# Patient Record
Sex: Female | Born: 1985 | Race: White | Hispanic: No | Marital: Married | Smoking: Former smoker
Health system: Southern US, Community
[De-identification: ages and names within clinical notes are randomized; demographics above are authoritative.]

## PROBLEM LIST (undated history)

## (undated) DIAGNOSIS — G43909 Migraine, unspecified, not intractable, without status migrainosus: Secondary | ICD-10-CM

## (undated) DIAGNOSIS — E669 Obesity, unspecified: Secondary | ICD-10-CM

## (undated) DIAGNOSIS — F419 Anxiety disorder, unspecified: Secondary | ICD-10-CM

## (undated) DIAGNOSIS — T7840XA Allergy, unspecified, initial encounter: Secondary | ICD-10-CM

## (undated) HISTORY — PX: TONSILLECTOMY: SUR1361

## (undated) HISTORY — DX: Allergy, unspecified, initial encounter: T78.40XA

## (undated) HISTORY — DX: Obesity, unspecified: E66.9

## (undated) HISTORY — DX: Anxiety disorder, unspecified: F41.9

## (undated) HISTORY — DX: Migraine, unspecified, not intractable, without status migrainosus: G43.909

---
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
## 2004-07-16 ENCOUNTER — Emergency Department: Payer: Self-pay | Admitting: Emergency Medicine

## 2005-04-02 ENCOUNTER — Emergency Department: Payer: Self-pay | Admitting: Emergency Medicine

## 2005-04-02 ENCOUNTER — Other Ambulatory Visit: Payer: Self-pay

## 2012-03-08 ENCOUNTER — Emergency Department: Payer: Self-pay | Admitting: *Deleted

## 2012-03-11 ENCOUNTER — Ambulatory Visit: Payer: Self-pay | Admitting: Orthopedic Surgery

## 2012-04-04 ENCOUNTER — Ambulatory Visit: Payer: Self-pay

## 2012-09-10 ENCOUNTER — Ambulatory Visit: Payer: Self-pay | Admitting: Family Medicine

## 2014-09-20 ENCOUNTER — Emergency Department: Payer: Self-pay | Admitting: Emergency Medicine

## 2014-09-20 LAB — CBC WITH DIFFERENTIAL/PLATELET
Basophil #: 0 10*3/uL (ref 0.0–0.1)
Basophil %: 0.5 %
Eosinophil #: 0.1 10*3/uL (ref 0.0–0.7)
Eosinophil %: 1.3 %
HCT: 42.8 % (ref 35.0–47.0)
HGB: 13.9 g/dL (ref 12.0–16.0)
Lymphocyte #: 1.4 10*3/uL (ref 1.0–3.6)
Lymphocyte %: 20.2 %
MCH: 31.8 pg (ref 26.0–34.0)
MCHC: 32.6 g/dL (ref 32.0–36.0)
MCV: 98 fL (ref 80–100)
MONOS PCT: 13.4 %
Monocyte #: 0.9 x10 3/mm (ref 0.2–0.9)
NEUTROS PCT: 64.6 %
Neutrophil #: 4.6 10*3/uL (ref 1.4–6.5)
Platelet: 293 10*3/uL (ref 150–440)
RBC: 4.39 10*6/uL (ref 3.80–5.20)
RDW: 13.2 % (ref 11.5–14.5)
WBC: 7.1 10*3/uL (ref 3.6–11.0)

## 2014-09-20 LAB — COMPREHENSIVE METABOLIC PANEL
ALT: 15 U/L
AST: 9 U/L — AB (ref 15–37)
Albumin: 3.8 g/dL (ref 3.4–5.0)
Alkaline Phosphatase: 63 U/L
Anion Gap: 6 — ABNORMAL LOW (ref 7–16)
BUN: 8 mg/dL (ref 7–18)
Bilirubin,Total: 0.5 mg/dL (ref 0.2–1.0)
CHLORIDE: 106 mmol/L (ref 98–107)
CO2: 26 mmol/L (ref 21–32)
Calcium, Total: 8.5 mg/dL (ref 8.5–10.1)
Creatinine: 0.81 mg/dL (ref 0.60–1.30)
EGFR (African American): >60
EGFR (Non-African Amer.): >60
Glucose: 94 mg/dL (ref 65–99)
OSMOLALITY: 274 (ref 275–301)
POTASSIUM: 3.6 mmol/L (ref 3.5–5.1)
Sodium: 138 mmol/L (ref 136–145)
TOTAL PROTEIN: 7.2 g/dL (ref 6.4–8.2)

## 2014-09-20 LAB — URINALYSIS, COMPLETE
BILIRUBIN, UR: NEGATIVE
Bacteria: NONE SEEN
GLUCOSE, UR: NEGATIVE mg/dL (ref 0–75)
KETONE: NEGATIVE
LEUKOCYTE ESTERASE: NEGATIVE
Nitrite: NEGATIVE
PH: 5 (ref 4.5–8.0)
Protein: NEGATIVE
RBC,UR: 1 /HPF (ref 0–5)
Specific Gravity: 1.02 (ref 1.003–1.030)
Squamous Epithelial: 1

## 2014-09-20 LAB — LIPASE, BLOOD: LIPASE: 167 U/L (ref 73–393)

## 2014-09-21 LAB — CLOSTRIDIUM DIFFICILE(ARMC)

## 2015-09-27 IMAGING — CT CT ABD-PELV W/ CM
2 of 4 series · 16 of 46 positions shown, 18 images · IV contrast (omnipaque)
Comparison: None.

CLINICAL DATA: Acute generalized abdominal pain.

EXAM:
CT ABDOMEN AND PELVIS WITH CONTRAST
TECHNIQUE: Multidetector CT imaging of the abdomen and pelvis was performed
using the standard protocol following bolus administration of
intravenous contrast.
CONTRAST:  100 mL Omnipaque 300 intravenously.

[Series 2: routine abd pel with · axial · 0.68mm/px · z∈[-555,-85]mm · 13 of 104 slices shown, 15 images]
[im 5/104  soft-tissue]
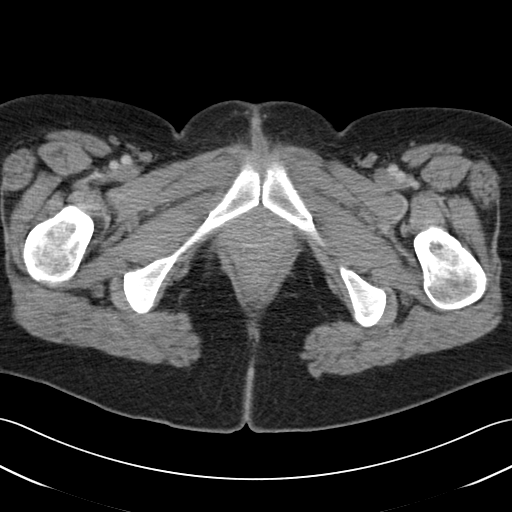
[im 5/104  bone]
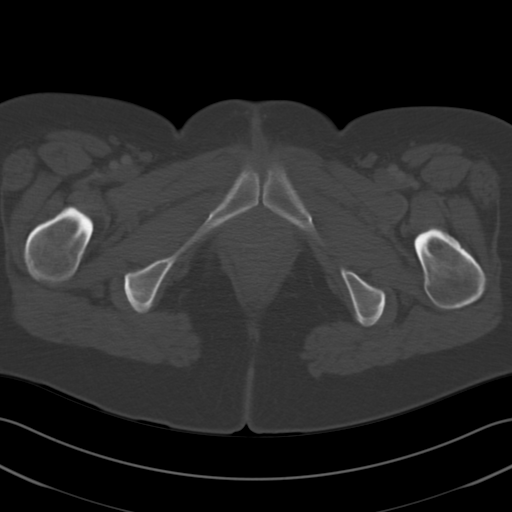
[im 13/104  soft-tissue]
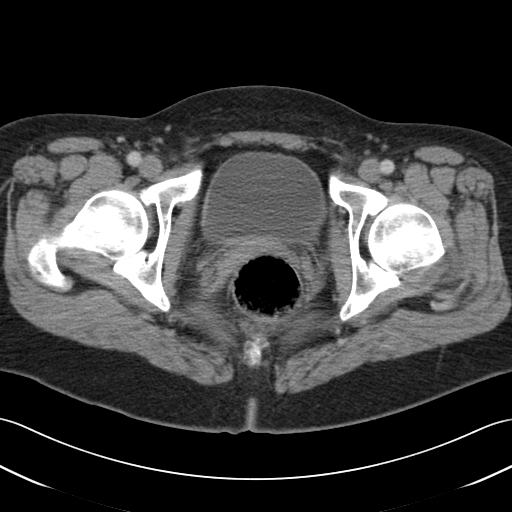
[im 21/104  soft-tissue]
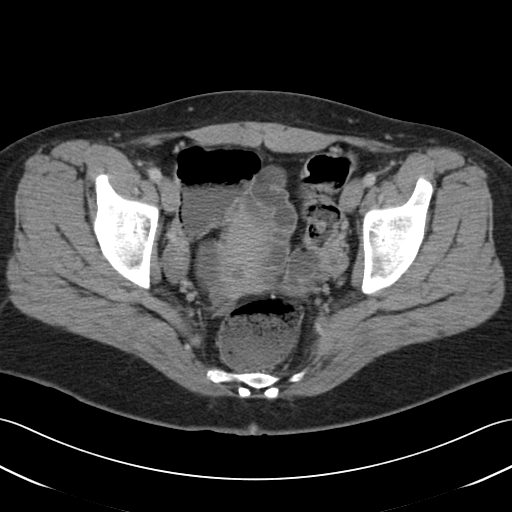
[im 29/104  soft-tissue]
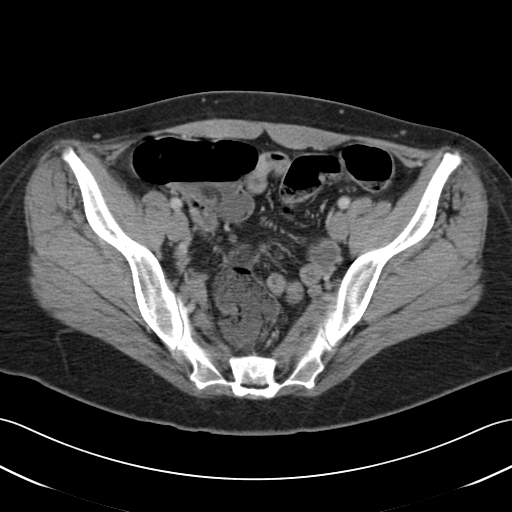
[im 38/104  soft-tissue]
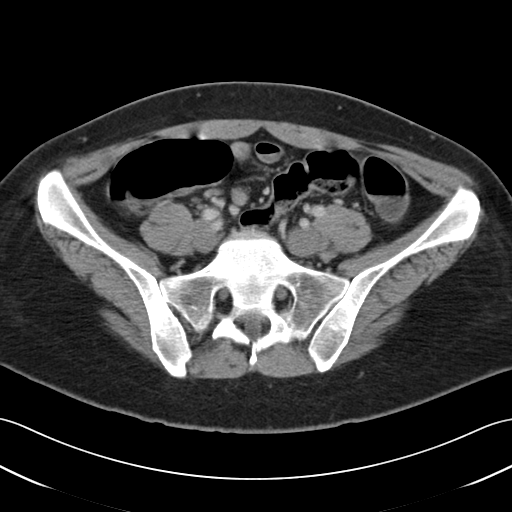
[im 46/104  soft-tissue]
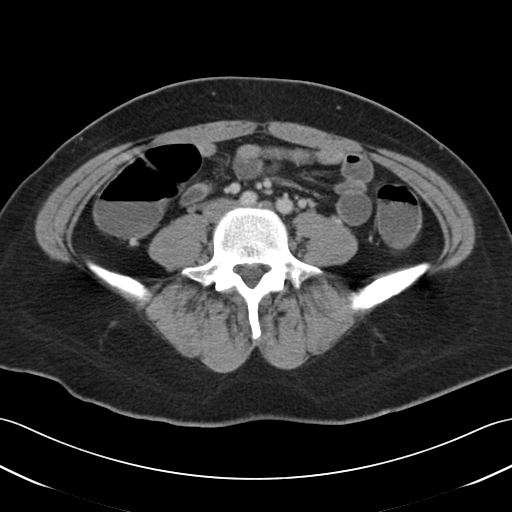
[im 54/104  soft-tissue]
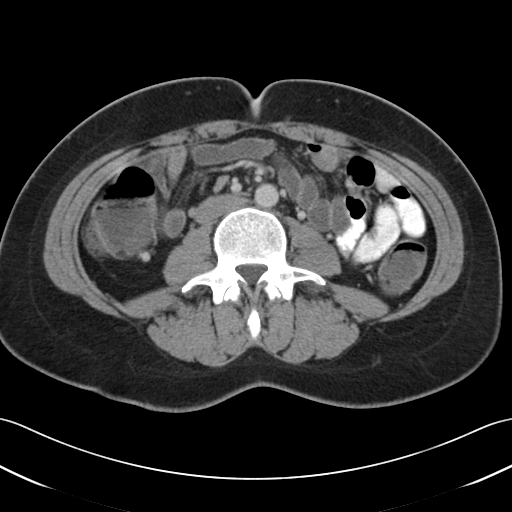
[im 58/104  soft-tissue]
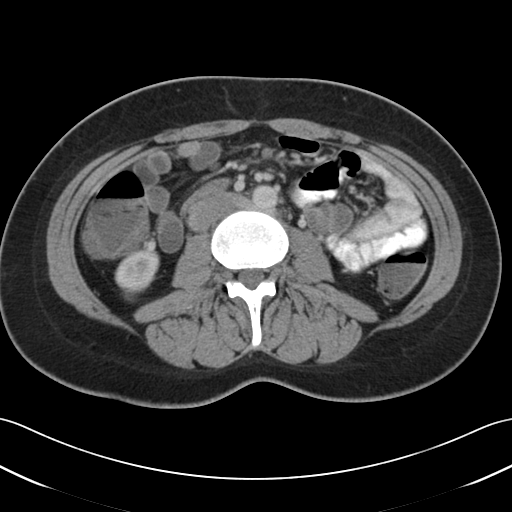
[im 66/104  soft-tissue]
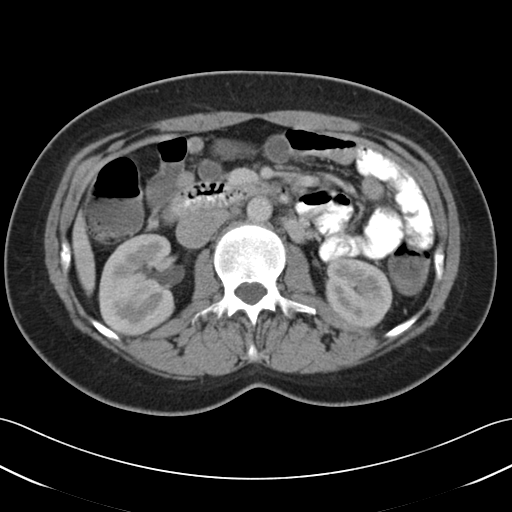
[im 66/104  bone]
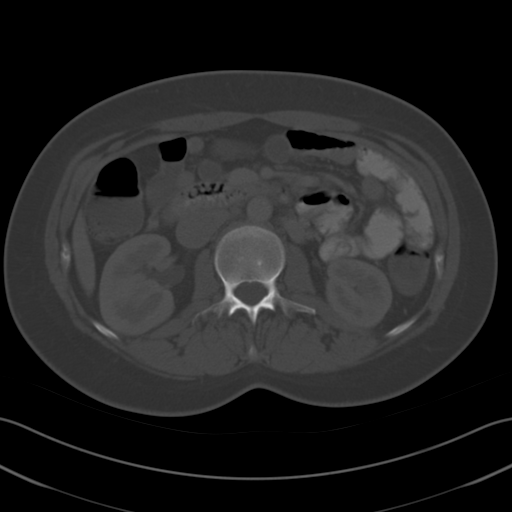
[im 75/104  soft-tissue]
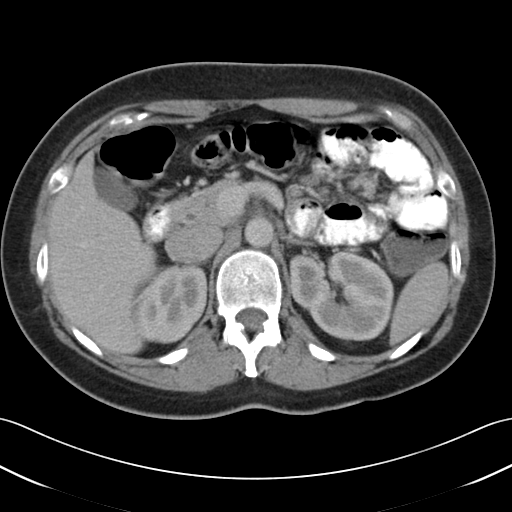
[im 83/104  soft-tissue]
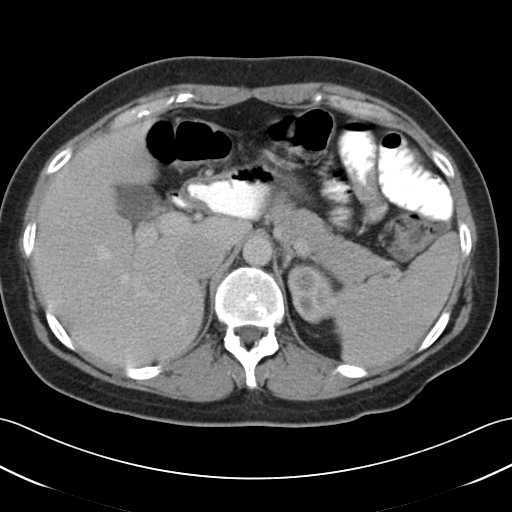
[im 91/104  soft-tissue]
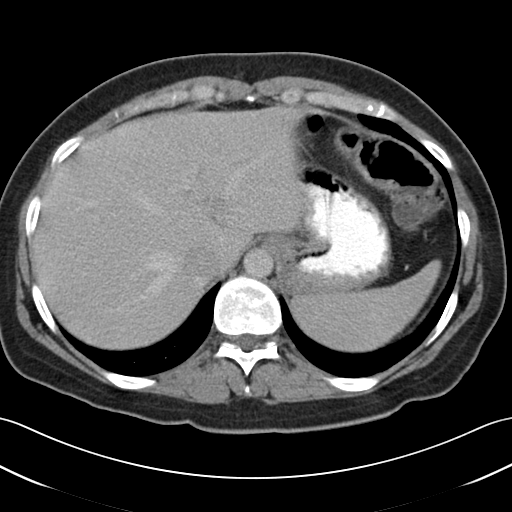
[im 99/104  soft-tissue]
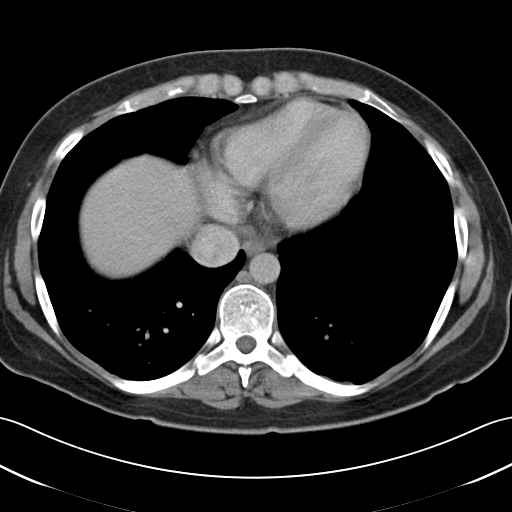

[Series 5: cor routine abd pel with · coronal · 0.69mm/px · 3 of 126 slices shown]
[im 42/126  soft-tissue]
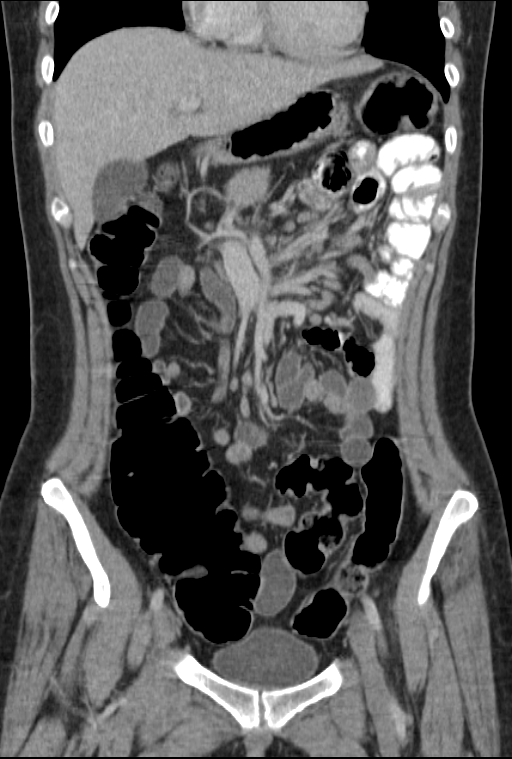
[im 56/126  soft-tissue]
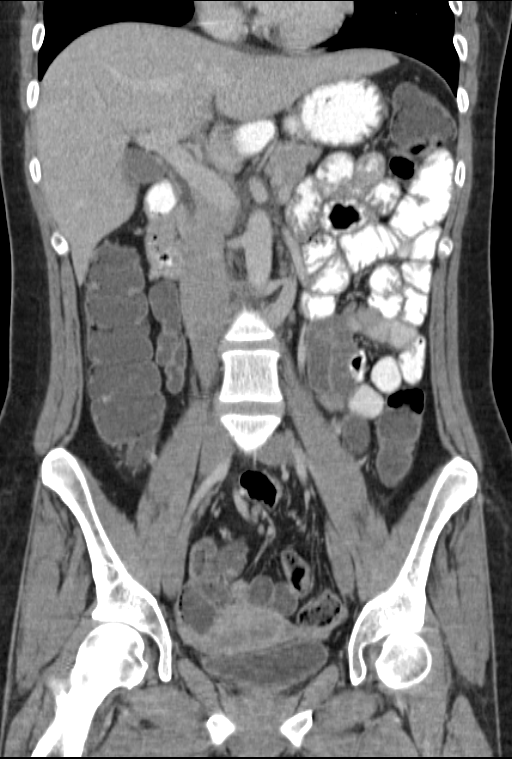
[im 70/126  soft-tissue]
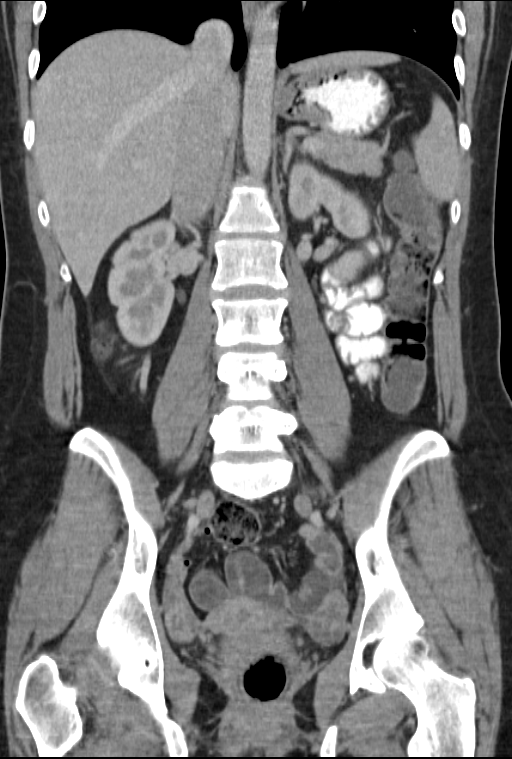

[16 of 46 positions shown; findings below may reference images not displayed]

FINDINGS: Visualized lung bases appear normal. No significant osseous
abnormality is noted.

No gallstones are noted. Several small cysts are noted in the
hepatic parenchyma. Otherwise, the liver spleen and pancreas appear
normal. Adrenal glands appear normal. No hydronephrosis or renal
obstruction is noted. No renal or ureteral calculi are noted. There
is no evidence of bowel obstruction. The appendix appears normal.
Uterus and urinary bladder appear normal. Ovaries appear normal. No
abnormal fluid collection is noted. No significant adenopathy is
noted.
IMPRESSION: No significant abnormality seen in the abdomen or pelvis.

## 2017-03-20 ENCOUNTER — Other Ambulatory Visit: Payer: Self-pay | Admitting: Nurse Practitioner

## 2017-03-20 ENCOUNTER — Ambulatory Visit
Admission: RE | Admit: 2017-03-20 | Discharge: 2017-03-20 | Disposition: A | Discharge status: Home / Self Care | Payer: 59 | Source: Ambulatory Visit | Attending: Nurse Practitioner | Admitting: Nurse Practitioner

## 2017-03-20 DIAGNOSIS — M25562 Pain in left knee: Secondary | ICD-10-CM | POA: Insufficient documentation

## 2017-03-20 DIAGNOSIS — M79605 Pain in left leg: Secondary | ICD-10-CM | POA: Diagnosis not present

## 2017-03-20 DIAGNOSIS — R52 Pain, unspecified: Secondary | ICD-10-CM

## 2018-03-06 ENCOUNTER — Ambulatory Visit: Payer: Managed Care, Other (non HMO) | Admitting: Nurse Practitioner

## 2018-03-06 ENCOUNTER — Encounter: Payer: Self-pay | Admitting: Nurse Practitioner

## 2018-03-06 VITALS — BP 134/90 | HR 65 | Resp 16 | Ht 72.0 in | Wt 252.2 lb

## 2018-03-06 DIAGNOSIS — M542 Cervicalgia: Secondary | ICD-10-CM | POA: Diagnosis not present

## 2018-03-06 DIAGNOSIS — M5000 Cervical disc disorder with myelopathy, unspecified cervical region: Secondary | ICD-10-CM

## 2018-03-06 NOTE — Progress Notes (Signed)
[facility] [address] [place], [place] [address]  Internal MEDICINE  Office Visit Note  Patient Name: Kaylee Rodgers  [id number]  [id number]  Date of Service: 03/23/2018    Pt is here for routine follow up.   Chief Complaint  Patient presents with  . Neck Pain    needs a work note  accomodation     The patient is having neck pain since job position changed 11/2017. She is more stationary and is doing more computer work. Causes neck to be stiff and sore.  She hears crunching when she turns her head from side to side. Hurts more in the morning and late in the afternoon. She is also very tall and having to position herself oddly to fit under her desk at work which is also causing increased joint and muscle pain. She has brought paperwork with her in order to get lift desk to accomodate her height and neck pain.      Current Medication: Outpatient Encounter Medications as of 03/06/2018  Medication Sig  . fexofenadine (ALLEGRA) 180 MG tablet Take 180 mg by mouth daily.  . Cholecalciferol (VITAMIN D3) 1000 units CAPS Take by mouth.   No facility-administered encounter medications on file as of 03/06/2018.     Surgical History: Past Surgical History:  Procedure Laterality Date  . TONSILLECTOMY      Medical History: Past Medical History:  Diagnosis Date  . Allergy     Family History: Family History  Problem Relation Age of Onset  . Arthritis/Rheumatoid Mother   . Diabetes Mother   . Hypertension Father     Social History   Socioeconomic History  . Marital status: Single    Spouse name: Not on file  . Number of children: Not on file  . Years of education: Not on file  . Highest education level: Not on file  Occupational History  . Not on file  Social Needs  . Financial resource strain: Not on file  . Food insecurity:    Worry: Not on file    Inability: Not on file  . Transportation needs:    Medical: Not on file    Non-medical: Not on file   Tobacco Use  . Smoking status: Never Smoker  . Smokeless tobacco: Never Used  Substance and Sexual Activity  . Alcohol use: Yes    Comment: socially  . Drug use: Never  . Sexual activity: Not on file  Lifestyle  . Physical activity:    Days per week: Not on file    Minutes per session: Not on file  . Stress: Not on file  Relationships  . Social connections:    Talks on phone: Not on file    Gets together: Not on file    Attends religious service: Not on file    Active member of club or organization: Not on file    Attends meetings of clubs or organizations: Not on file    Relationship status: Not on file  . Intimate partner violence:    Fear of current or ex partner: Not on file    Emotionally abused: Not on file    Physically abused: Not on file    Forced sexual activity: Not on file  Other Topics Concern  . Not on file  Social History Narrative  . Not on file      Review of Systems  Constitutional: Negative for activity change, chills, fatigue and unexpected weight change.  HENT: Negative for congestion, postnasal  drip, rhinorrhea, sneezing and sore throat.   Eyes: Negative.  Negative for redness.  Respiratory: Negative for cough, chest tightness, shortness of breath and wheezing.   Cardiovascular: Negative for chest pain and palpitations.  Gastrointestinal: Negative for abdominal pain, constipation, diarrhea, nausea and vomiting.  Endocrine: Negative for cold intolerance, heat intolerance, polydipsia, polyphagia and polyuria.  Genitourinary: Negative.  Negative for dysuria and frequency.  Musculoskeletal: Positive for back pain and myalgias. Negative for arthralgias, joint swelling and neck pain.  Skin: Negative for rash.  Allergic/Immunologic: Negative for environmental allergies.  Neurological: Positive for headaches. Negative for tremors and numbness.  Hematological: Negative for adenopathy. Does not bruise/bleed easily.  Psychiatric/Behavioral: Negative for  behavioral problems (Depression), dysphoric mood, sleep disturbance and suicidal ideas. The patient is not nervous/anxious.     Today's Vitals   03/06/18 1122  BP: 134/90  Pulse: 65  Resp: 16  SpO2: 98%  Weight: 252 lb 3.2 oz (114.4 kg)  Height: 6' (1.829 m)    Physical Exam  Constitutional: She is oriented to person, place, and time. She appears well-developed and well-nourished. No distress.  HENT:  Head: Normocephalic and atraumatic.  Nose: Nose normal.  Mouth/Throat: Oropharynx is clear and moist. No oropharyngeal exudate.  Eyes: Pupils are equal, round, and reactive to light. Conjunctivae and EOM are normal.  Neck: Normal range of motion. Neck supple. No JVD present. No tracheal deviation present. No thyromegaly present.  Cardiovascular: Normal rate, regular rhythm and normal heart sounds. Exam reveals no gallop and no friction rub.  No murmur heard. Pulmonary/Chest: Effort normal and breath sounds normal. No respiratory distress. She has no wheezes. She has no rales. She exhibits no tenderness.  Abdominal: Soft. Bowel sounds are normal. There is no tenderness.  Musculoskeletal: Normal range of motion.  Lymphadenopathy:    She has no cervical adenopathy.  Neurological: She is alert and oriented to person, place, and time. No cranial nerve deficit.  Skin: Skin is warm and dry. Capillary refill takes less than 2 seconds. She is not diaphoretic.  Psychiatric: She has a normal mood and affect. Her behavior is normal. Judgment and thought content normal.  Nursing note and vitals reviewed.   Assessment/Plan: 1. Neck pain, musculoskeletal X-ray cervical spine for further evaluation of neck pain. Will compose letter of necessity for patient to get standing desk to help alleviate neck pain.  - DG Cervical Spine Complete; Future  2. Cervical disc disorder with myelopathy X-ray cervical spine.  General Counseling: Kaylee Rodgers understanding of the findings of todays visit and  agrees with plan of treatment. I have discussed any further diagnostic evaluation that may be needed or ordered today. We also reviewed her medications today. she has been encouraged to call the office with any questions or concerns that should arise related to todays visit.    Counseling:    Orders Placed This Encounter  Procedures  . DG Cervical Spine Complete    This patient was seen by Vincent Gros, FNP- C in Collaboration with Dr Lyndon Code as a part of collaborative care agreement  Time spent: 25 Minutes     Dr Lyndon Code Internal medicine

## 2018-03-23 ENCOUNTER — Encounter: Payer: Self-pay | Admitting: Nurse Practitioner

## 2018-03-23 DIAGNOSIS — M542 Cervicalgia: Secondary | ICD-10-CM | POA: Insufficient documentation

## 2018-03-26 ENCOUNTER — Telehealth: Payer: Self-pay

## 2018-03-26 NOTE — Telephone Encounter (Signed)
Pt advised we faxed her paper to [facility]

## 2018-03-27 IMAGING — CR DG TIBIA/FIBULA 2V*L*
1 series · 4 of 4 positions shown · non-contrast
Comparison: None.

CLINICAL DATA: Left leg pain, no known injury, initial encounter

EXAM:
LEFT TIBIA AND FIBULA - 2 VIEW

[Series 1: dg tibia/fibula left · 0.14mm/px · 4 of 4 slices shown]
[im 1/4]
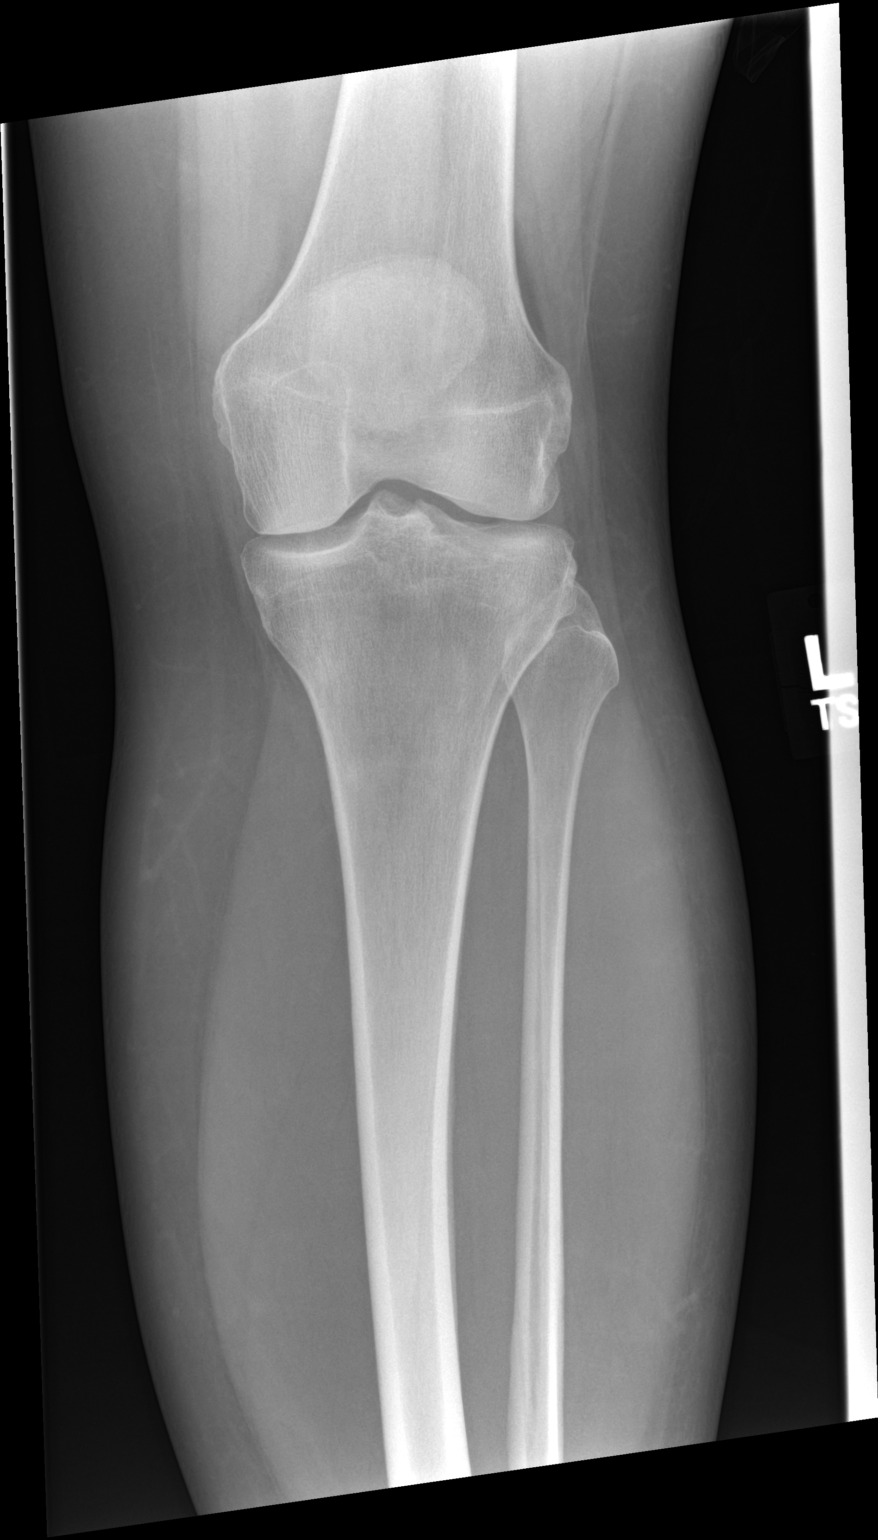
[im 2/4]
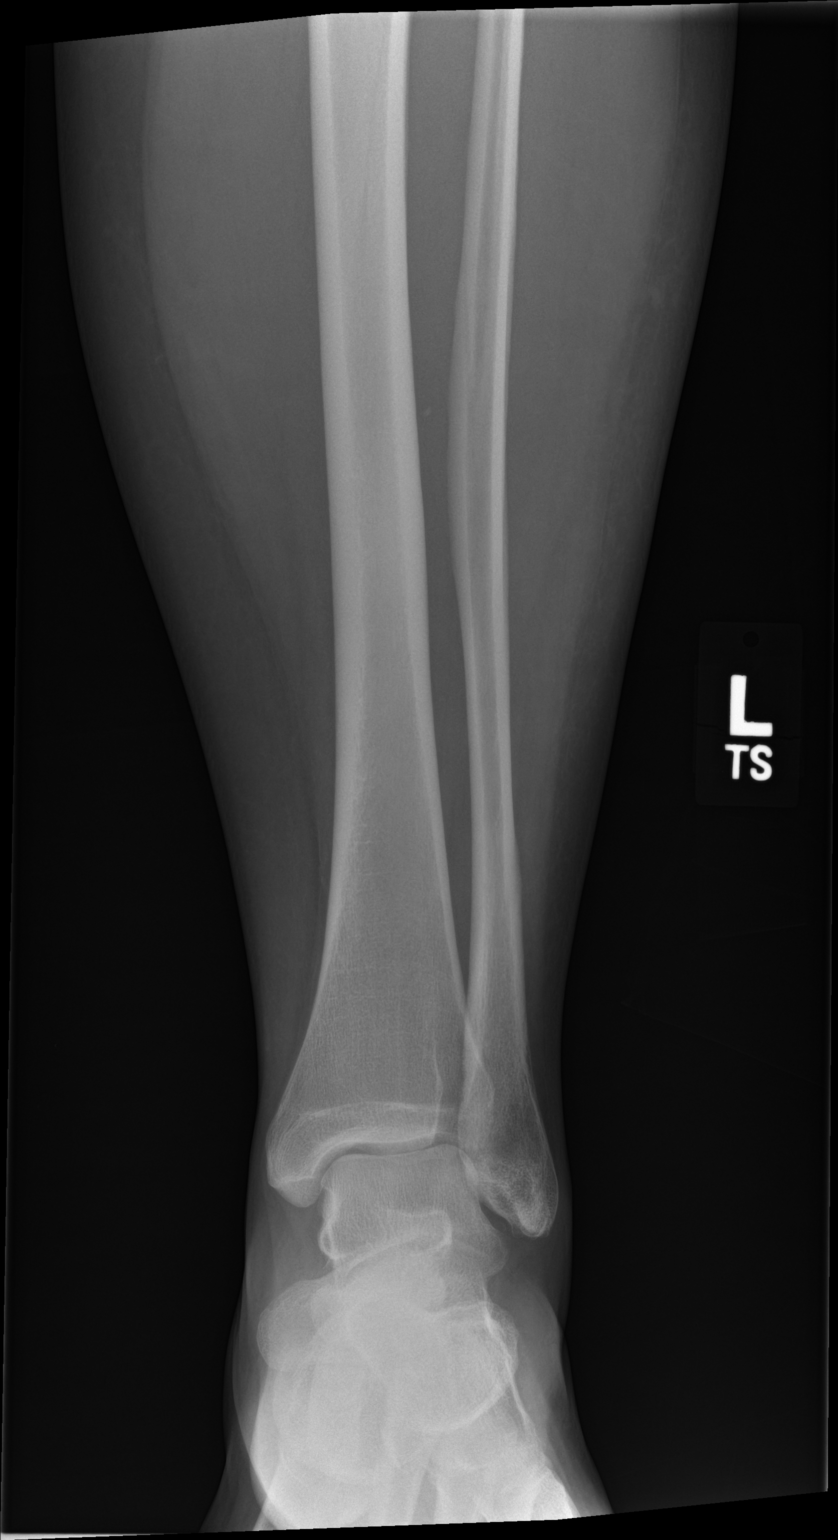
[im 3/4]
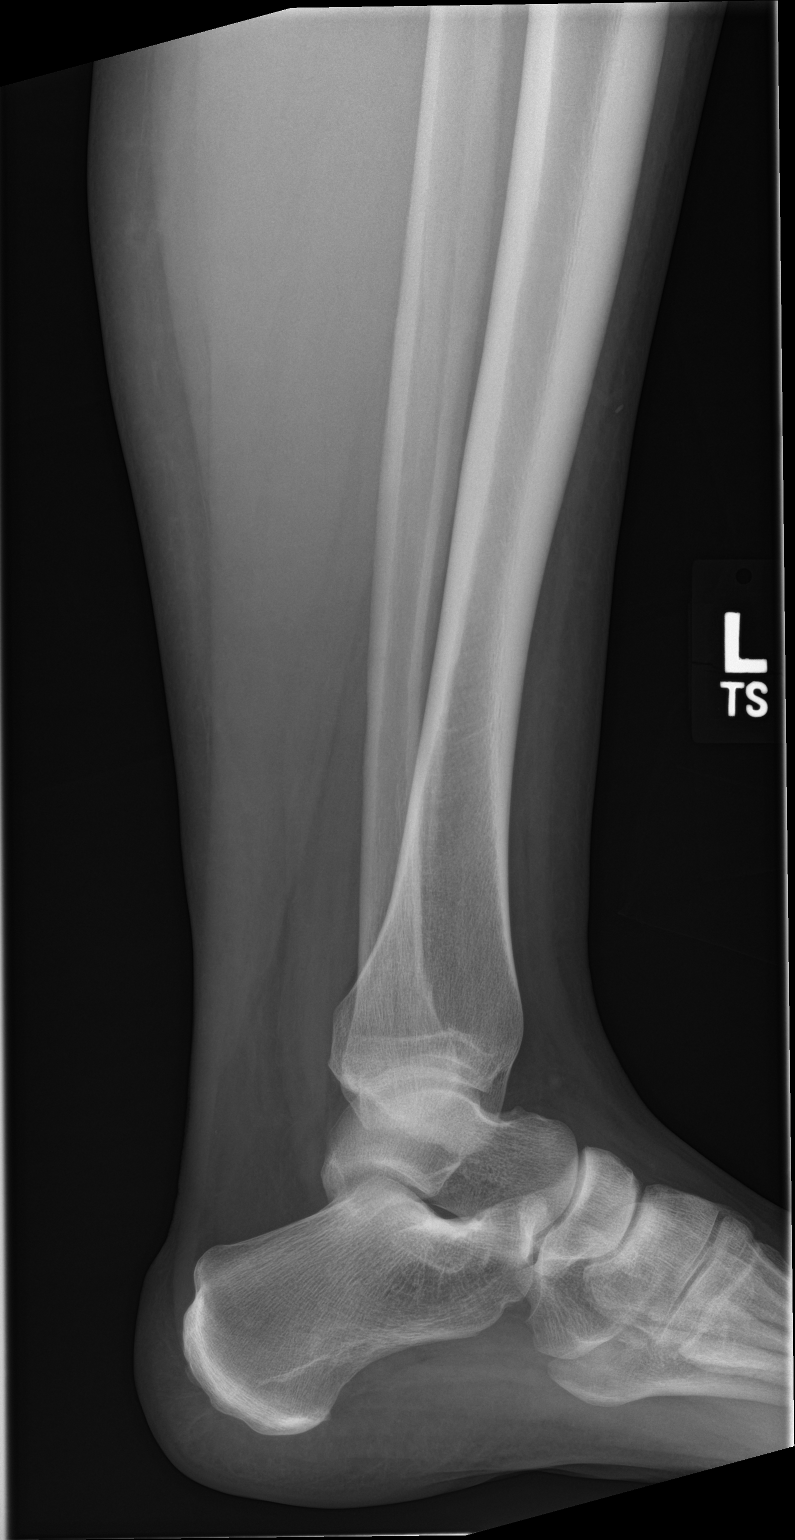
[im 4/4]
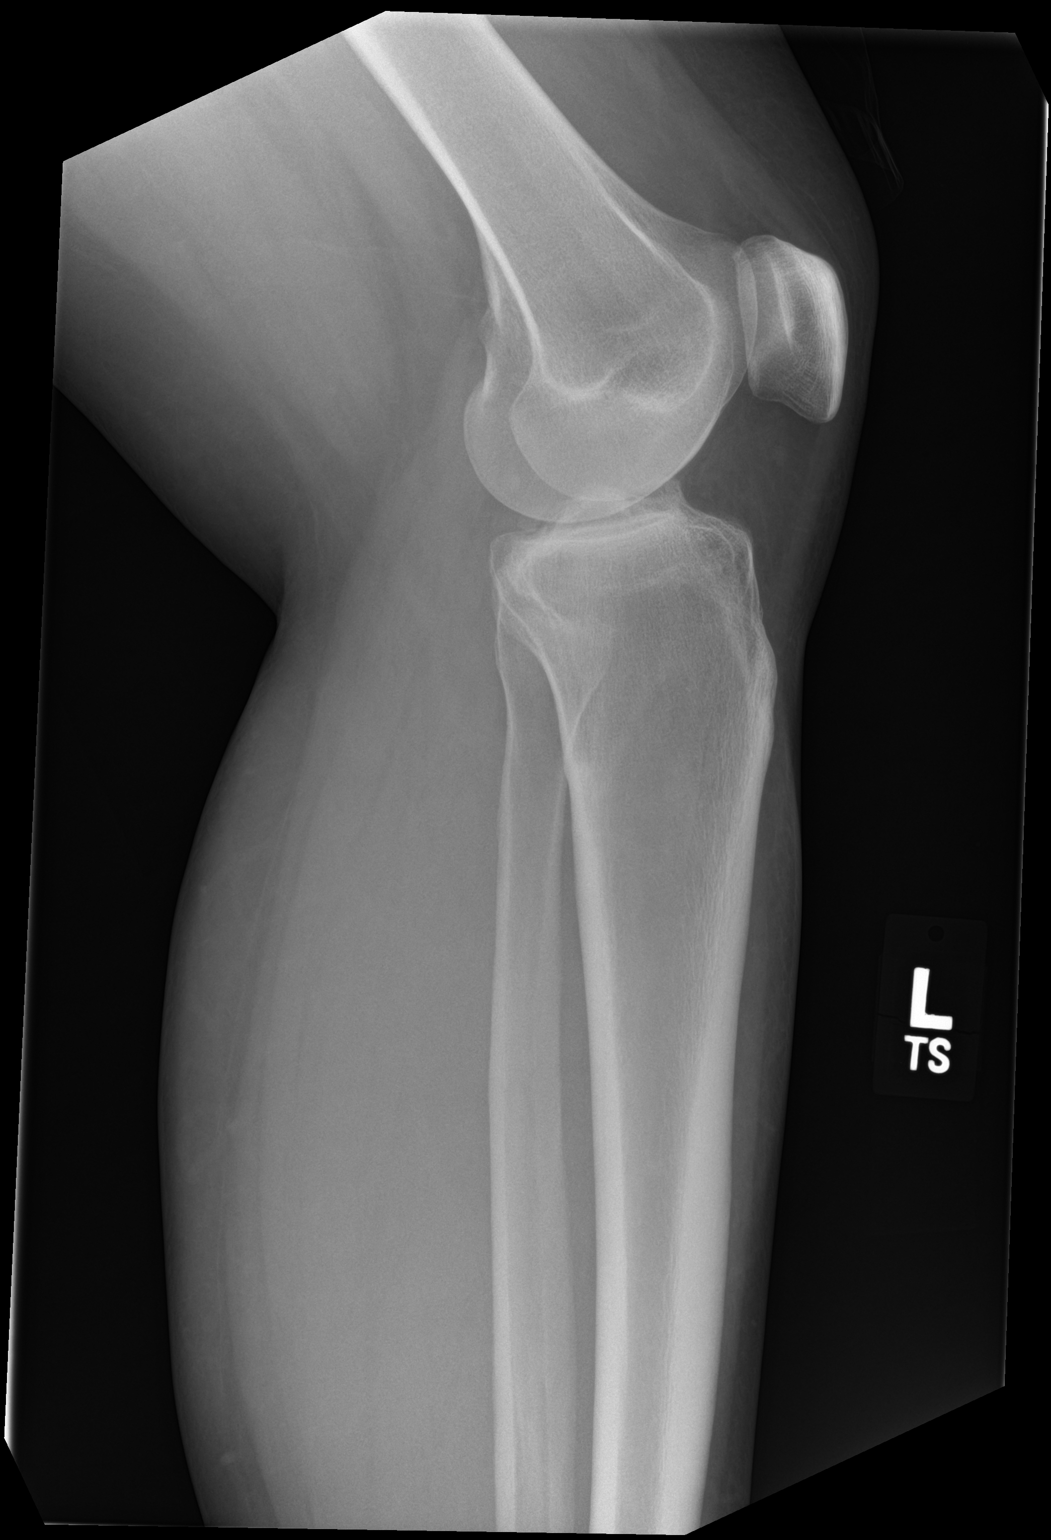

[4 of 4 positions shown; findings below may reference images not displayed]

FINDINGS: There is no evidence of fracture or other focal bone lesions. Soft
tissues are unremarkable.
IMPRESSION: No acute abnormality noted.

## 2018-03-31 ENCOUNTER — Telehealth: Payer: Self-pay | Admitting: Nurse Practitioner

## 2018-04-11 ENCOUNTER — Telehealth: Payer: Self-pay

## 2018-04-11 NOTE — Telephone Encounter (Signed)
Patient advised paperwork is ready for pickup.Kaylee Rodgers

## 2018-05-13 NOTE — Telephone Encounter (Signed)
Done, patient picked up paperwork back in July.Beth

## 2018-06-12 ENCOUNTER — Ambulatory Visit (INDEPENDENT_AMBULATORY_CARE_PROVIDER_SITE_OTHER): Payer: Managed Care, Other (non HMO) | Admitting: Nurse Practitioner

## 2018-06-12 ENCOUNTER — Encounter: Payer: Self-pay | Admitting: Nurse Practitioner

## 2018-06-12 VITALS — BP 120/80 | HR 67 | Resp 16 | Ht 72.0 in | Wt 260.0 lb

## 2018-06-12 DIAGNOSIS — Z0001 Encounter for general adult medical examination with abnormal findings: Secondary | ICD-10-CM

## 2018-06-12 DIAGNOSIS — Z6835 Body mass index (BMI) 35.0-35.9, adult: Secondary | ICD-10-CM | POA: Diagnosis not present

## 2018-06-12 DIAGNOSIS — Z23 Encounter for immunization: Secondary | ICD-10-CM

## 2018-06-12 DIAGNOSIS — M542 Cervicalgia: Secondary | ICD-10-CM

## 2018-06-12 NOTE — Progress Notes (Signed)
[facility] [address] [place], [place] [address]  Internal MEDICINE  Office Visit Note  Patient Name: Kaylee Rodgers  [id number]  [id number]  Date of Service: 06/18/2018    ationPt is here for routine health maintenance examin   Chief Complaint  Patient presents with  . Neck Pain  . Annual Exam     The patient is c/o left sided neck pain. Has been going on since job position changed 11/2017. She is more stationary and is doing more computer work. Causes neck to be stiff and sore.  She hears crunching when she turns her head from side to side. Hurts more in the morning and late in the afternoon. She is also very tall and having to position herself oddly to fit under her desk at work which is also causing increased joint and muscle pain. She has brought paperwork with her in order to get lift desk to accomodate her height and neck pain. Did get lift desk which has helped some.  She is also reporting a near ten pound weight gain over the past 1 year. Knowing that weight might become a problem with increased premiums in her health insurance, she has started working out three to four times per week. Started this new routine about 4 to 6 weeks ago. She has also started to make some very positive changes in her diet, reducing her calorie intake and improving the quality of the calories she does consume. Her health insurance has required she have an appeal form completed by me, as she has not shown any weight loss since last year. This form is required to prevent increase in her health insurance premiums.    Current Medication: Outpatient Encounter Medications as of 06/12/2018  Medication Sig  . Cholecalciferol (VITAMIN D3) 1000 units CAPS Take by mouth.  . fexofenadine (ALLEGRA) 180 MG tablet Take 180 mg by mouth daily.   No facility-administered encounter medications on file as of 06/12/2018.     Surgical History: Past Surgical History:  Procedure Laterality Date  .  TONSILLECTOMY      Medical History: Past Medical History:  Diagnosis Date  . Allergy     Family History: Family History  Problem Relation Age of Onset  . Arthritis/Rheumatoid Mother   . Diabetes Mother   . Hypertension Father       Review of Systems  Constitutional: Negative for activity change, chills, fatigue and unexpected weight change.       Ten pound weight gain over the past year.   HENT: Negative for congestion, postnasal drip, rhinorrhea, sneezing and sore throat.   Eyes: Negative.  Negative for redness.  Respiratory: Negative for cough, chest tightness, shortness of breath and wheezing.   Cardiovascular: Negative for chest pain and palpitations.  Gastrointestinal: Negative for abdominal pain, constipation, diarrhea, nausea and vomiting.  Endocrine: Negative for cold intolerance, heat intolerance, polydipsia, polyphagia and polyuria.  Genitourinary: Negative for dysuria, frequency and menstrual problem.  Musculoskeletal: Positive for back pain, myalgias, neck pain and neck stiffness. Negative for arthralgias and joint swelling.  Skin: Negative for rash.  Allergic/Immunologic: Negative for environmental allergies.  Neurological: Positive for headaches. Negative for dizziness, tremors and numbness.  Hematological: Negative for adenopathy. Does not bruise/bleed easily.  Psychiatric/Behavioral: Negative for behavioral problems (Depression), dysphoric mood, sleep disturbance and suicidal ideas. The patient is not nervous/anxious.      Today's Vitals   06/12/18 1404  BP: 120/80  Pulse: 67  Resp: 16  SpO2: 98%  Weight:  260 lb (117.9 kg)  Height: 6' (1.829 m)    Physical Exam  Constitutional: She is oriented to person, place, and time. She appears well-developed and well-nourished. No distress.  HENT:  Head: Normocephalic and atraumatic.  Nose: Nose normal.  Mouth/Throat: Oropharynx is clear and moist. No oropharyngeal exudate.  Eyes: Pupils are equal, round,  and reactive to light. Conjunctivae and EOM are normal.  Neck: Neck supple. No JVD present. Spinous process tenderness present. Decreased range of motion present. No tracheal deviation present. No thyromegaly present.  Cardiovascular: Normal rate, regular rhythm, normal heart sounds and intact distal pulses. Exam reveals no gallop and no friction rub.  No murmur heard. Pulmonary/Chest: Effort normal and breath sounds normal. No respiratory distress. She has no wheezes. She has no rales. She exhibits no tenderness. Right breast exhibits no inverted nipple, no mass, no nipple discharge, no skin change and no tenderness. Left breast exhibits no inverted nipple, no mass, no nipple discharge, no skin change and no tenderness.  Abdominal: Soft. Bowel sounds are normal. There is no tenderness.  Lymphadenopathy:    She has no cervical adenopathy.  Neurological: She is alert and oriented to person, place, and time. No cranial nerve deficit.  Skin: Skin is warm and dry. Capillary refill takes less than 2 seconds. She is not diaphoretic.  Psychiatric: She has a normal mood and affect. Her behavior is normal. Judgment and thought content normal.  Nursing note and vitals reviewed.    LABS: Recent Results (from the past 2160 hour(s))  UA/M w/rflx Culture, Routine     Status: None   Collection Time: 06/12/18  2:06 PM  Result Value Ref Range   Specific Gravity, UA 1.005 1.005 - 1.030   pH, UA 7.0 5.0 - 7.5   Color, UA Yellow Yellow   Appearance Ur Clear Clear   Leukocytes, UA Negative Negative   Protein, UA Negative Negative/Trace   Glucose, UA Negative Negative   Ketones, UA Negative Negative   RBC, UA Negative Negative   Bilirubin, UA Negative Negative   Urobilinogen, Ur 0.2 0.2 - 1.0 mg/dL   Nitrite, UA Negative Negative   Microscopic Examination Comment     Comment: Microscopic follows if indicated.   Microscopic Examination See below:     Comment: Microscopic was indicated and was performed.    Urinalysis Reflex Comment     Comment: This specimen will not reflex to a Urine Culture.  Microscopic Examination     Status: None   Collection Time: 06/12/18  2:06 PM  Result Value Ref Range   WBC, UA 0-5 0 - 5 /hpf   RBC, UA 0-2 0 - 2 /hpf   Epithelial Cells (non renal) 0-10 0 - 10 /hpf   Casts None seen None seen /lpf   Mucus, UA Present Not Estab.   Bacteria, UA Few None seen/Few    Assessment/Plan: 1. Encounter for general adult medical examination with abnormal findings Annual health maintenance exam today. - UA/M w/rflx Culture, Routine  2. Neck pain, musculoskeletal Use OTC NSAIDs and/or tylenol to reduce pain and inflammation. Recommend use of heating pad to affected area for 15-20 minutes at a time to help relieve tight muscles. Advised her to get x-ray of cervical spine, ordered at hr previous visit.   3. Body mass index (BMI) 35.0-35.9, adult Discussed weight loss plan with the patient. Recommended a 1500 calorie diet and also advised she advance her exercise regimen as tolerated. Written information was provided. Appeal form for her  health insurance was completed and returned to patient at the time of the visit.   4. Flu vaccine need - Flu Vaccine MDCK QUAD PF  General Counseling: Kaylee Rodgers verbalizes understanding of the findings of todays visit and agrees with plan of treatment. I have discussed any further diagnostic evaluation that may be needed or ordered today. We also reviewed her medications today. she has been encouraged to call the office with any questions or concerns that should arise related to todays visit.    Counseling:   There is a liability release in patients' chart. There has been a 10 minute discussion about the side effects including but not limited to elevated blood pressure, anxiety, lack of sleep and dry mouth. Pt understands and will like to start/continue on appetite suppressant at this time. There will be one month RX given at the time of  visit with proper follow up. Nova diet plan with restricted calories is given to the pt. Pt understands and agrees with  plan of treatment  This patient was seen by Vincent Gros FNP Collaboration with Dr Lyndon Code as a part of collaborative care agreement   Orders Placed This Encounter  Procedures  . Microscopic Examination  . Flu Vaccine MDCK QUAD PF  . UA/M w/rflx Culture, Routine      Time spent: 58 Minutes      Lyndon Code, MD  Internal Medicine

## 2018-06-13 LAB — UA/M W/RFLX CULTURE, ROUTINE
BILIRUBIN UA: NEGATIVE
GLUCOSE, UA: NEGATIVE
Ketones, UA: NEGATIVE
Leukocytes, UA: NEGATIVE
Nitrite, UA: NEGATIVE
PROTEIN UA: NEGATIVE
RBC, UA: NEGATIVE
SPEC GRAV UA: 1.005 (ref 1.005–1.030)
Urobilinogen, Ur: 0.2 mg/dL (ref 0.2–1.0)
pH, UA: 7 (ref 5.0–7.5)

## 2018-06-13 LAB — MICROSCOPIC EXAMINATION: Casts: NONE SEEN /lpf

## 2018-06-18 DIAGNOSIS — Z6836 Body mass index (BMI) 36.0-36.9, adult: Secondary | ICD-10-CM | POA: Insufficient documentation

## 2018-06-18 DIAGNOSIS — Z23 Encounter for immunization: Secondary | ICD-10-CM | POA: Insufficient documentation

## 2018-06-18 DIAGNOSIS — Z124 Encounter for screening for malignant neoplasm of cervix: Secondary | ICD-10-CM | POA: Insufficient documentation

## 2018-06-18 DIAGNOSIS — Z6833 Body mass index (BMI) 33.0-33.9, adult: Secondary | ICD-10-CM | POA: Insufficient documentation

## 2018-06-18 DIAGNOSIS — Z0001 Encounter for general adult medical examination with abnormal findings: Principal | ICD-10-CM

## 2018-06-18 DIAGNOSIS — Z6834 Body mass index (BMI) 34.0-34.9, adult: Secondary | ICD-10-CM | POA: Insufficient documentation

## 2018-06-18 DIAGNOSIS — Z6835 Body mass index (BMI) 35.0-35.9, adult: Secondary | ICD-10-CM | POA: Insufficient documentation

## 2018-12-05 ENCOUNTER — Encounter: Payer: Self-pay | Admitting: Adult Health

## 2018-12-05 ENCOUNTER — Ambulatory Visit: Payer: Managed Care, Other (non HMO) | Admitting: Adult Health

## 2018-12-05 ENCOUNTER — Other Ambulatory Visit: Payer: Self-pay

## 2018-12-05 VITALS — BP 122/88 | HR 81 | Temp 98.6°F | Resp 16 | Ht 72.0 in | Wt 259.0 lb

## 2018-12-05 DIAGNOSIS — J0110 Acute frontal sinusitis, unspecified: Secondary | ICD-10-CM | POA: Diagnosis not present

## 2018-12-05 DIAGNOSIS — B3731 Acute candidiasis of vulva and vagina: Secondary | ICD-10-CM

## 2018-12-05 DIAGNOSIS — B373 Candidiasis of vulva and vagina: Secondary | ICD-10-CM | POA: Diagnosis not present

## 2018-12-05 DIAGNOSIS — J029 Acute pharyngitis, unspecified: Secondary | ICD-10-CM

## 2018-12-05 DIAGNOSIS — Z6835 Body mass index (BMI) 35.0-35.9, adult: Secondary | ICD-10-CM

## 2018-12-05 LAB — POCT INFLUENZA A/B
INFLUENZA B, POC: NEGATIVE
Influenza A, POC: NEGATIVE

## 2018-12-05 MED ORDER — AMOXICILLIN-POT CLAVULANATE 875-125 MG PO TABS: 1.0000 | ORAL_TABLET | Freq: Two times a day (BID) | ORAL | 0 refills | Status: DC

## 2018-12-05 MED ORDER — FLUCONAZOLE 150 MG PO TABS: 150.0000 mg | ORAL_TABLET | ORAL | 1 refills | Status: DC | PRN

## 2018-12-05 NOTE — Patient Instructions (Signed)

## 2018-12-05 NOTE — Progress Notes (Signed)
[facility] [address] [place], [place] [address]  Internal MEDICINE  Office Visit Note  Patient Name: Kaylee Rodgers  [id number]  [id number]  Date of Service: 12/05/2018  Chief Complaint  Patient presents with  . Cough  . Headache  . Sore Throat     HPI Pt is here for a sick visit. She reports 2 days of cough, headache, sore throat, PND and ear fullness.  She denies body aches, or fever, but reports a few episodes of chills.  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      Current Medication:  Outpatient Encounter Medications as of 12/05/2018  Medication Sig  . Cholecalciferol (VITAMIN D3) 1000 units CAPS Take by mouth.  . fexofenadine (ALLEGRA) 180 MG tablet Take 180 mg by mouth daily.  Marland Kitchen amoxicillin-clavulanate (AUGMENTIN) 875-125 MG tablet Take 1 tablet by mouth 2 (two) times daily.  . fluconazole (DIFLUCAN) 150 MG tablet Take 1 tablet (150 mg total) by mouth every three (3) days as needed.   No facility-administered encounter medications on file as of 12/05/2018.       Medical History: Past Medical History:  Diagnosis Date  . Allergy      Vital Signs: BP 122/88   Pulse 81   Temp 98.6 F (37 C)   Resp 16   Ht 6' (1.829 m)   Wt 259 lb (117.5 kg)   SpO2 99%   BMI 35.13 kg/m    Review of Systems  Constitutional: Negative for chills, fatigue and unexpected weight change.  HENT: Positive for ear discharge, postnasal drip and sore throat. Negative for congestion, rhinorrhea and sneezing.   Eyes: Negative for photophobia, pain and redness.  Respiratory: Positive for cough. Negative for chest tightness and shortness of breath.   Cardiovascular: Negative for chest pain and palpitations.  Gastrointestinal: Negative for abdominal pain, constipation, diarrhea, nausea and vomiting.  Endocrine: Negative.   Genitourinary: Negative for dysuria and frequency.  Musculoskeletal: Negative for arthralgias, back pain, joint swelling  and neck pain.  Skin: Negative for rash.  Allergic/Immunologic: Negative.   Neurological: Negative for tremors and numbness.  Hematological: Negative for adenopathy. Does not bruise/bleed easily.  Psychiatric/Behavioral: Negative for behavioral problems and sleep disturbance. The patient is not nervous/anxious.     Physical Exam Vitals signs and nursing note reviewed.  Constitutional:      General: She is not in acute distress.    Appearance: She is well-developed. She is not diaphoretic.  HENT:     Head: Normocephalic and atraumatic.     Mouth/Throat:     Pharynx: No oropharyngeal exudate.  Eyes:     Pupils: Pupils are equal, round, and reactive to light.  Neck:     Musculoskeletal: Normal range of motion and neck supple.     Thyroid: No thyromegaly.     Vascular: No JVD.     Trachea: No tracheal deviation.  Cardiovascular:     Rate and Rhythm: Normal rate and regular rhythm.     Heart sounds: Normal heart sounds. No murmur. No friction rub. No gallop.   Pulmonary:     Effort: Pulmonary effort is normal. No respiratory distress.     Breath sounds: Normal breath sounds. No wheezing or rales.  Chest:     Chest wall: No tenderness.  Abdominal:     Palpations: Abdomen is soft.     Tenderness: There is no abdominal tenderness. There is no guarding.  Musculoskeletal: Normal range of motion.  Lymphadenopathy:     Cervical: No cervical adenopathy.  Skin:    General: Skin is warm and dry.  Neurological:     Mental Status: She is alert and oriented to person, place, and time.     Cranial Nerves: No cranial nerve deficit.  Psychiatric:        Behavior: Behavior normal.        Thought Content: Thought content normal.        Judgment: Judgment normal.    Assessment/Plan: 1. Acute non-recurrent frontal sinusitis Take Augmentin as directed, and RTC if symptoms fail to improve.   2. Sore throat Negative for Flu - POCT Influenza A/B  3. Body mass index (BMI) 35.0-35.9,  adult Obesity Counseling: Risk Assessment: An assessment of behavioral risk factors was made today and includes lack of exercise sedentary lifestyle, lack of portion control and poor dietary habits.  Risk Modification Advice: She was counseled on portion control guidelines. Restricting daily caloric intake to. . The detrimental long term effects of obesity on her health and ongoing poor compliance was also discussed with the patient.    General Counseling: lassandra amalfitano understanding of the findings of todays visit and agrees with plan of treatment. I have discussed any further diagnostic evaluation that may be needed or ordered today. We also reviewed her medications today. she has been encouraged to call the office with any questions or concerns that should arise related to todays visit.   Orders Placed This Encounter  Procedures  . POCT Influenza A/B    Meds ordered this encounter  Medications  . amoxicillin-clavulanate (AUGMENTIN) 875-125 MG tablet    Sig: Take 1 tablet by mouth 2 (two) times daily.    Dispense:  14 tablet    Refill:  0  . fluconazole (DIFLUCAN) 150 MG tablet    Sig: Take 1 tablet (150 mg total) by mouth every three (3) days as needed.    Dispense:  3 tablet    Refill:  1    Time spent: 25 Minutes  This patient was seen by Blima Ledger AGNP-C in Collaboration with Dr Lyndon Code as a part of collaborative care agreement.  Johnna Acosta AGNP-C Internal Medicine

## 2018-12-11 ENCOUNTER — Other Ambulatory Visit: Payer: Self-pay | Admitting: Adult Health

## 2018-12-11 MED ORDER — PREDNISONE 10 MG PO TABS: ORAL_TABLET | ORAL | 0 refills | Status: DC

## 2018-12-11 NOTE — Progress Notes (Signed)
Prednisone dose pack sent to patientss pharmacy for continued cough.

## 2019-02-11 ENCOUNTER — Ambulatory Visit: Payer: Managed Care, Other (non HMO) | Admitting: Nurse Practitioner

## 2019-02-11 ENCOUNTER — Other Ambulatory Visit: Payer: Self-pay

## 2019-02-11 ENCOUNTER — Encounter: Payer: Self-pay | Admitting: Nurse Practitioner

## 2019-02-11 VITALS — BP 133/89 | HR 76 | Temp 97.7°F | Resp 16 | Ht 72.0 in | Wt 263.0 lb

## 2019-02-11 DIAGNOSIS — N390 Urinary tract infection, site not specified: Secondary | ICD-10-CM | POA: Diagnosis not present

## 2019-02-11 DIAGNOSIS — R300 Dysuria: Secondary | ICD-10-CM

## 2019-02-11 LAB — POCT URINALYSIS DIPSTICK
Bilirubin, UA: NEGATIVE
Blood, UA: NEGATIVE
Glucose, UA: NEGATIVE
Ketones, UA: NEGATIVE
Leukocytes, UA: NEGATIVE
Nitrite, UA: NEGATIVE
Protein, UA: NEGATIVE
Spec Grav, UA: 1.01 (ref 1.010–1.025)
Urobilinogen, UA: 0.2 E.U./dL
pH, UA: 5 (ref 5.0–8.0)

## 2019-02-11 MED ORDER — SULFAMETHOXAZOLE-TRIMETHOPRIM 800-160 MG PO TABS: 1.0000 | ORAL_TABLET | Freq: Two times a day (BID) | ORAL | 0 refills | Status: DC

## 2019-02-11 MED ORDER — PHENAZOPYRIDINE HCL 200 MG PO TABS: 200.0000 mg | ORAL_TABLET | Freq: Three times a day (TID) | ORAL | 0 refills | Status: DC | PRN

## 2019-02-11 NOTE — Progress Notes (Signed)
[facility] [address] [place], [place] [address]  Internal MEDICINE  Office Visit Note  Patient Name: Kaylee GammaKristin N Rodgers  [id number]  [id number]  Date of Service: 02/11/2019   Pt is here for a sick visit.  Chief Complaint  Patient presents with  . Urinary Tract Infection    urine frequency  . Back Pain     The patient is complaining of urinary frequency and pressure in the bladder when she urinates. Also having some lower back pain. Symptoms have been going on for past 1.5 days. She denies nausea, vomiting, or abdominal pain. She denies fever.       Current Medication:  Outpatient Encounter Medications as of 02/11/2019  Medication Sig  . Cholecalciferol (VITAMIN D3) 1000 units CAPS Take by mouth.  . fexofenadine (ALLEGRA) 180 MG tablet Take 180 mg by mouth daily.  . fluconazole (DIFLUCAN) 150 MG tablet Take 1 tablet (150 mg total) by mouth every three (3) days as needed.  . phenazopyridine (PYRIDIUM) 200 MG tablet Take 1 tablet (200 mg total) by mouth 3 (three) times daily as needed for pain.  Marland Kitchen. sulfamethoxazole-trimethoprim (BACTRIM DS) 800-160 MG tablet Take 1 tablet by mouth 2 (two) times daily.  . [DISCONTINUED] amoxicillin-clavulanate (AUGMENTIN) 875-125 MG tablet Take 1 tablet by mouth 2 (two) times daily. (Patient not taking: Reported on 02/11/2019)  . [DISCONTINUED] predniSONE (DELTASONE) 10 MG tablet Use per dose pack (Patient not taking: Reported on 02/11/2019)   No facility-administered encounter medications on file as of 02/11/2019.       Medical History: Past Medical History:  Diagnosis Date  . Allergy      Today's Vitals   02/11/19 1001  BP: 133/89  Pulse: 76  Resp: 16  Temp: 97.7 F (36.5 C)  SpO2: 100%  Weight: 263 lb (119.3 kg)  Height: 6' (1.829 m)   Body mass index is 35.67 kg/m.   Review of Systems  Constitutional: Negative for activity change, chills, fatigue and unexpected weight change.  HENT: Negative for congestion,  postnasal drip, rhinorrhea, sneezing and sore throat.   Respiratory: Negative for cough, chest tightness and shortness of breath.   Cardiovascular: Negative for chest pain and palpitations.  Gastrointestinal: Negative for abdominal pain, constipation, diarrhea, nausea and vomiting.  Genitourinary: Positive for dysuria, flank pain, frequency and urgency.  Musculoskeletal: Positive for back pain. Negative for arthralgias, joint swelling and neck pain.  Skin: Negative for rash.  Neurological: Negative for tremors and numbness.  Hematological: Negative for adenopathy. Does not bruise/bleed easily.  Psychiatric/Behavioral: Negative for behavioral problems (Depression), sleep disturbance and suicidal ideas. The patient is not nervous/anxious.     Physical Exam Vitals signs and nursing note reviewed.  Constitutional:      General: She is not in acute distress.    Appearance: Normal appearance. She is well-developed. She is not diaphoretic.  HENT:     Head: Normocephalic and atraumatic.     Mouth/Throat:     Pharynx: No oropharyngeal exudate.  Eyes:     Pupils: Pupils are equal, round, and reactive to light.  Neck:     Musculoskeletal: Normal range of motion and neck supple.     Thyroid: No thyromegaly.     Vascular: No JVD.     Trachea: No tracheal deviation.  Cardiovascular:     Rate and Rhythm: Normal rate and regular rhythm.     Heart sounds: Normal heart sounds. No murmur. No friction rub. No gallop.   Pulmonary:  Effort: Pulmonary effort is normal. No respiratory distress.     Breath sounds: Normal breath sounds. No wheezing or rales.  Chest:     Chest wall: No tenderness.  Genitourinary:    Comments: Urine sample negative for evidence of infection or pther abnormalities today.  Musculoskeletal: Normal range of motion.  Lymphadenopathy:     Cervical: No cervical adenopathy.  Skin:    General: Skin is warm and dry.  Neurological:     Mental Status: She is alert and  oriented to person, place, and time.     Cranial Nerves: No cranial nerve deficit.  Psychiatric:        Behavior: Behavior normal.        Thought Content: Thought content normal.        Judgment: Judgment normal.    Assessment/Plan: 1. Urinary tract infection without hematuria, site unspecified Treat with Bactrim DS bid for 5 days. Send urine for culture and sensitivity nd adjust antibiotics as indicated.  - sulfamethoxazole-trimethoprim (BACTRIM DS) 800-160 MG tablet; Take 1 tablet by mouth 2 (two) times daily.  Dispense: 10 tablet; Refill: 0  2. Dysuria May take pyridium 200mg  up to three times daily as needed for bladder pain and spasms. - POCT Urinalysis Dipstick - phenazopyridine (PYRIDIUM) 200 MG tablet; Take 1 tablet (200 mg total) by mouth 3 (three) times daily as needed for pain.  Dispense: 10 tablet; Refill: 0  General Counseling: Luceil verbalizes understanding of the findings of todays visit and agrees with plan of treatment. I have discussed any further diagnostic evaluation that may be needed or ordered today. We also reviewed her medications today. she has been encouraged to call the office with any questions or concerns that should arise related to todays visit.    Counseling:  This patient was seen by Vincent Gros FNP Collaboration with Dr Lyndon Code as a part of collaborative care agreement  Orders Placed This Encounter  Procedures  . POCT Urinalysis Dipstick    Meds ordered this encounter  Medications  . sulfamethoxazole-trimethoprim (BACTRIM DS) 800-160 MG tablet    Sig: Take 1 tablet by mouth 2 (two) times daily.    Dispense:  10 tablet    Refill:  0    Order Specific Question:   Supervising Provider    Answer:   Lyndon Code [[id number]]  . phenazopyridine (PYRIDIUM) 200 MG tablet    Sig: Take 1 tablet (200 mg total) by mouth 3 (three) times daily as needed for pain.    Dispense:  10 tablet    Refill:  0    Order Specific Question:   Supervising  Provider    Answer:   Lyndon Code [[id number]]    Time spent: 25 Minutes

## 2019-02-14 LAB — CULTURE, URINE COMPREHENSIVE

## 2019-02-19 ENCOUNTER — Encounter: Payer: Self-pay | Admitting: Nurse Practitioner

## 2019-02-19 ENCOUNTER — Other Ambulatory Visit: Payer: Self-pay

## 2019-02-20 ENCOUNTER — Other Ambulatory Visit: Payer: Self-pay

## 2019-02-20 DIAGNOSIS — Z20828 Contact with and (suspected) exposure to other viral communicable diseases: Secondary | ICD-10-CM

## 2019-02-24 LAB — SAR COV2 SEROLOGY (COVID19)AB(IGG),IA: SARS-CoV-2 Ab, IgG: NEGATIVE

## 2019-04-20 ENCOUNTER — Ambulatory Visit: Payer: Managed Care, Other (non HMO) | Admitting: Nurse Practitioner

## 2019-04-20 ENCOUNTER — Other Ambulatory Visit: Payer: Self-pay

## 2019-04-20 ENCOUNTER — Encounter: Payer: Self-pay | Admitting: Nurse Practitioner

## 2019-04-20 VITALS — BP 118/82 | HR 85 | Temp 98.7°F | Resp 16 | Ht 72.0 in | Wt 266.0 lb

## 2019-04-20 DIAGNOSIS — L247 Irritant contact dermatitis due to plants, except food: Secondary | ICD-10-CM

## 2019-04-20 MED ORDER — PREDNISONE 10 MG (21) PO TBPK: ORAL_TABLET | ORAL | 0 refills | Status: DC

## 2019-04-20 MED ORDER — TRIAMCINOLONE ACETONIDE 0.025 % EX CREA: 1.0000 "application " | TOPICAL_CREAM | Freq: Two times a day (BID) | CUTANEOUS | 2 refills | Status: DC

## 2019-04-20 NOTE — Progress Notes (Signed)
[facility] [place],  [address]  Internal MEDICINE  Office Visit Note  Patient Name: Kaylee Rodgers  [id number]  [id number]  Date of Service: 04/26/2019   Pt is here for a sick visit.  Chief Complaint  Patient presents with  . Rash    noticed poison ivy on saturday, pt has history of getting it really bad and esach occasion it gets worse and worse, noticed it on arms , ches, and some spots on legs, itches really bad, bathed in dawn and it helped it to not spread     The patient is here for sick visit. She has poison ivy on her arms, spreading to her legs and chest. Started Saturday after working in the yard. Has gradually become worse. Very itchy. Patient has been very sensitive to poison ivy in the past.        Current Medication:  Outpatient Encounter Medications as of 04/20/2019  Medication Sig  . Cholecalciferol (VITAMIN D3) 1000 units CAPS Take by mouth.  . fexofenadine (ALLEGRA) 180 MG tablet Take 180 mg by mouth daily.  Marland Kitchen ibuprofen (ADVIL) 200 MG tablet Take by mouth.  . predniSONE (STERAPRED UNI-PAK 21 TAB) 10 MG (21) TBPK tablet 6 day taper - take by mouth as directed for 6 days  . triamcinolone (KENALOG) 0.025 % cream Apply 1 application topically 2 (two) times daily.  . [DISCONTINUED] fluconazole (DIFLUCAN) 150 MG tablet Take 1 tablet (150 mg total) by mouth every three (3) days as needed. (Patient not taking: Reported on 04/20/2019)  . [DISCONTINUED] phenazopyridine (PYRIDIUM) 200 MG tablet Take 1 tablet (200 mg total) by mouth 3 (three) times daily as needed for pain. (Patient not taking: Reported on 04/20/2019)  . [DISCONTINUED] sulfamethoxazole-trimethoprim (BACTRIM DS) 800-160 MG tablet Take 1 tablet by mouth 2 (two) times daily. (Patient not taking: Reported on 04/20/2019)   No facility-administered encounter medications on file as of 04/20/2019.       Medical History: Past Medical History:  Diagnosis Date  . Allergy       Today's Vitals   04/20/19 1505  BP: 118/82  Pulse: 85  Resp: 16  Temp: 98.7 F (37.1 C)  SpO2: 98%  Weight: 266 lb (120.7 kg)  Height: 6' (1.829 m)   Body mass index is 36.08 kg/m.  Review of Systems  Constitutional: Negative for chills, fatigue and unexpected weight change.  HENT: Negative for congestion, postnasal drip, rhinorrhea, sneezing and sore throat.   Respiratory: Negative for cough, chest tightness and shortness of breath.   Cardiovascular: Negative for chest pain and palpitations.  Gastrointestinal: Negative for abdominal pain, constipation, diarrhea, nausea and vomiting.  Musculoskeletal: Negative for arthralgias, back pain, joint swelling and neck pain.  Skin: Positive for rash.       Poison ivy rash on both hands and lower arms. sprading to chest and abdomen .  Allergic/Immunologic: Positive for environmental allergies.  Neurological: Negative.  Negative for tremors and numbness.  Hematological: Negative for adenopathy. Does not bruise/bleed easily.  Psychiatric/Behavioral: Negative for behavioral problems (Depression), sleep disturbance and suicidal ideas. The patient is not nervous/anxious.     Physical Exam Vitals signs and nursing note reviewed.  Constitutional:      General: She is not in acute distress.    Appearance: Normal appearance. She is well-developed. She is not diaphoretic.  HENT:     Head: Normocephalic and atraumatic.     Mouth/Throat:     Pharynx: No oropharyngeal exudate.  Eyes:  Pupils: Pupils are equal, round, and reactive to light.  Neck:     Musculoskeletal: Normal range of motion and neck supple.     Thyroid: No thyromegaly.     Vascular: No JVD.     Trachea: No tracheal deviation.  Cardiovascular:     Rate and Rhythm: Normal rate and regular rhythm.     Heart sounds: Normal heart sounds. No murmur. No friction rub. No gallop.   Pulmonary:     Effort: Pulmonary effort is normal. No respiratory distress.     Breath  sounds: Normal breath sounds. No wheezing or rales.  Chest:     Chest wall: No tenderness.  Abdominal:     Palpations: Abdomen is soft.  Musculoskeletal: Normal range of motion.  Lymphadenopathy:     Cervical: No cervical adenopathy.  Skin:    General: Skin is warm and dry.     Findings: Erythema present.     Comments: There is pustular rash on both hands and wrists. Spreading up to the forearms and upper arms. Few lesions on the chest. All erythematous. Some scabbed.   Neurological:     Mental Status: She is alert and oriented to person, place, and time.     Cranial Nerves: No cranial nerve deficit.  Psychiatric:        Behavior: Behavior normal.        Thought Content: Thought content normal.        Judgment: Judgment normal.   Assessment/Plan: 1. Irritant contact dermatitis due to plants, except food Start prednisone 10mg  dose pack. Take as directed for 6 days. May use triamcinolone cream twice daily as needed to reduce itching and irritation. Recommended OTC Zenfel to use as wash to reduce spread of poison ivy or poison oak and reduce healing time. - predniSONE (STERAPRED UNI-PAK 21 TAB) 10 MG (21) TBPK tablet; 6 day taper - take by mouth as directed for 6 days  Dispense: 21 tablet; Refill: 0 - triamcinolone (KENALOG) 0.025 % cream; Apply 1 application topically 2 (two) times daily.  Dispense: 80 g; Refill: 2  General Counseling: Kaylee Rodgers verbalizes understanding of the findings of todays visit and agrees with plan of treatment. I have discussed any further diagnostic evaluation that may be needed or ordered today. We also reviewed her medications today. she has been encouraged to call the office with any questions or concerns that should arise related to todays visit.    Counseling:  This patient was seen by Vincent GrosHeather Sunset Joshi FNP Collaboration with Dr Lyndon CodeFozia M Khan as a part of collaborative care agreement  Meds ordered this encounter  Medications  . predniSONE (STERAPRED UNI-PAK  21 TAB) 10 MG (21) TBPK tablet    Sig: 6 day taper - take by mouth as directed for 6 days    Dispense:  21 tablet    Refill:  0    Order Specific Question:   Supervising Provider    Answer:   Lyndon CodeKHAN, FOZIA M [[id number]]  . triamcinolone (KENALOG) 0.025 % cream    Sig: Apply 1 application topically 2 (two) times daily.    Dispense:  80 g    Refill:  2    Order Specific Question:   Supervising Provider    Answer:   Lyndon CodeKHAN, FOZIA M [[id number]]    Time spent: 15 Minutes

## 2019-04-26 DIAGNOSIS — L247 Irritant contact dermatitis due to plants, except food: Secondary | ICD-10-CM | POA: Insufficient documentation

## 2019-06-16 ENCOUNTER — Encounter: Payer: Self-pay | Admitting: Nurse Practitioner

## 2019-06-16 ENCOUNTER — Ambulatory Visit (INDEPENDENT_AMBULATORY_CARE_PROVIDER_SITE_OTHER): Payer: Managed Care, Other (non HMO) | Admitting: Nurse Practitioner

## 2019-06-16 ENCOUNTER — Other Ambulatory Visit: Payer: Self-pay

## 2019-06-16 VITALS — BP 116/72 | HR 81 | Ht 72.0 in | Wt 260.0 lb

## 2019-06-16 DIAGNOSIS — Z124 Encounter for screening for malignant neoplasm of cervix: Secondary | ICD-10-CM

## 2019-06-16 DIAGNOSIS — R300 Dysuria: Secondary | ICD-10-CM

## 2019-06-16 DIAGNOSIS — Z0001 Encounter for general adult medical examination with abnormal findings: Secondary | ICD-10-CM | POA: Diagnosis not present

## 2019-06-16 DIAGNOSIS — R5383 Other fatigue: Secondary | ICD-10-CM | POA: Insufficient documentation

## 2019-06-16 NOTE — Progress Notes (Signed)
Kaylee Rodgers [place], [place] [address]  Internal MEDICINE  Office Visit Note  Patient Name: Kaylee Rodgers  [id number]  [id number]  Date of Service: 06/16/2019   Pt is here for routine health maintenance examination  Chief Complaint  Patient presents with  . Annual Exam  . Gynecologic Exam  . Allergies  . Ear Fullness    sometimes right ear feels full, like swimmer's ears and it comes and goes     The patient is here for health maintenance exam and pap smear. She reports feeling fluid in the right ear. Comes and goes. Has been going on for about a month. Does not really hurt. If she puts upward pressure on the lower part of the ear, she can feel it popping. She denies pain or fever. Denies nasal congestion or other symptoms.     Current Medication: Outpatient Encounter Medications as of 06/16/2019  Medication Sig  . Cholecalciferol (VITAMIN D3) 1000 units CAPS Take by mouth.  . fexofenadine (ALLEGRA) 180 MG tablet Take 180 mg by mouth daily.  Marland Kitchen ibuprofen (ADVIL) 200 MG tablet Take by mouth.  . [DISCONTINUED] predniSONE (STERAPRED UNI-PAK 21 TAB) 10 MG (21) TBPK tablet 6 day taper - take by mouth as directed for 6 days (Patient not taking: Reported on 06/16/2019)  . [DISCONTINUED] triamcinolone (KENALOG) 0.025 % cream Apply 1 application topically 2 (two) times daily. (Patient not taking: Reported on 06/16/2019)   No facility-administered encounter medications on file as of 06/16/2019.     Surgical History: Past Surgical History:  Procedure Laterality Date  . TONSILLECTOMY      Medical History: Past Medical History:  Diagnosis Date  . Allergy     Family History: Family History  Problem Relation Age of Onset  . Arthritis/Rheumatoid Mother   . Diabetes Mother   . Hypertension Father       Review of Systems  Constitutional: Negative for activity change, chills, fatigue and unexpected weight change.  HENT: Positive for postnasal drip.  Negative for congestion, rhinorrhea, sneezing and sore throat.        Right ear feels full. No pain or congestion reported   Respiratory: Negative for cough, chest tightness, shortness of breath and wheezing.   Cardiovascular: Negative for chest pain and palpitations.  Gastrointestinal: Negative for abdominal pain, constipation, diarrhea, nausea and vomiting.  Endocrine: Negative for cold intolerance, heat intolerance, polydipsia and polyuria.  Genitourinary: Negative for dysuria, frequency and menstrual problem.  Musculoskeletal: Negative for arthralgias, back pain, joint swelling and neck pain.  Skin: Negative for rash.  Neurological: Negative.  Negative for tremors and numbness.  Hematological: Negative for adenopathy. Does not bruise/bleed easily.  Psychiatric/Behavioral: Positive for decreased concentration. Negative for behavioral problems (Depression), sleep disturbance and suicidal ideas. The patient is not nervous/anxious.     Today's Vitals   06/16/19 1410  BP: 116/72  Pulse: 81  SpO2: 98%  Weight: 260 lb (117.9 kg)  Height: 6' (1.829 m)   Body mass index is 35.26 kg/m.  Physical Exam Vitals signs and nursing note reviewed.  Constitutional:      General: She is not in acute distress.    Appearance: Normal appearance. She is well-developed. She is not diaphoretic.  HENT:     Head: Normocephalic and atraumatic.     Mouth/Throat:     Pharynx: No oropharyngeal exudate.  Eyes:     Extraocular Movements: Extraocular movements intact.     Conjunctiva/sclera: Conjunctivae normal.     Pupils:  Pupils are equal, round, and reactive to light.  Neck:     Musculoskeletal: Normal range of motion and neck supple.     Thyroid: No thyromegaly.     Vascular: No JVD.     Trachea: No tracheal deviation.  Cardiovascular:     Rate and Rhythm: Normal rate and regular rhythm.     Pulses: Normal pulses.     Heart sounds: Normal heart sounds. No murmur. No friction rub. No gallop.    Pulmonary:     Effort: Pulmonary effort is normal. No respiratory distress.     Breath sounds: Normal breath sounds. No wheezing or rales.  Chest:     Chest wall: No tenderness.     Breasts:        Right: Normal. No swelling, bleeding, inverted nipple, mass, nipple discharge, skin change or tenderness.        Left: Normal. No swelling, bleeding, inverted nipple, mass, nipple discharge, skin change or tenderness.  Abdominal:     General: Bowel sounds are normal.     Palpations: Abdomen is soft.     Tenderness: There is no abdominal tenderness.  Genitourinary:    General: Normal vulva.     Exam position: Supine.     Labia:        Right: No rash or tenderness.        Left: No rash or tenderness.      Vagina: Normal. No vaginal discharge, erythema or tenderness.     Cervix: No cervical motion tenderness, discharge, friability or erythema.     Uterus: Normal.      Adnexa: Right adnexa normal and left adnexa normal.     Comments: No tenderness, masses, or organomeglay present during bimanual exam . Musculoskeletal: Normal range of motion.  Lymphadenopathy:     Cervical: No cervical adenopathy.     Lower Body: No right inguinal adenopathy. No left inguinal adenopathy.  Skin:    General: Skin is warm and dry.  Neurological:     Mental Status: She is alert and oriented to person, place, and time.     Cranial Nerves: No cranial nerve deficit.  Psychiatric:        Behavior: Behavior normal.        Thought Content: Thought content normal.        Judgment: Judgment normal.    Assessment/Plan:  1. Encounter for general adult medical examination with abnormal findings Annual health miantenance exam today  2. Other fatigue Check labs including thyroid panel for further evaluation and treatment.   3. Routine cervical smear - Pap IG and HPV (high risk) DNA detection  4. Dysuria - UA/M w/rflx Culture, Routine   General Counseling: Kaylee Rodgers verbalizes understanding of the findings of  todays visit and agrees with plan of treatment. I have discussed any further diagnostic evaluation that may be needed or ordered today. We also reviewed her medications today. she has been encouraged to call the office with any questions or concerns that should arise related to todays visit.    Counseling:  This patient was seen by Vincent GrosHeather Durward Matranga FNP Collaboration with Dr Lyndon CodeFozia M Khan as a part of collaborative care agreement  Orders Placed This Encounter  Procedures  . UA/M w/rflx Culture, Routine      Time spent: 5930 Minutes      Lyndon CodeFozia M Khan, MD  Internal Medicine

## 2019-06-17 LAB — UA/M W/RFLX CULTURE, ROUTINE
Bilirubin, UA: NEGATIVE
Glucose, UA: NEGATIVE
Ketones, UA: NEGATIVE
Leukocytes,UA: NEGATIVE
Nitrite, UA: NEGATIVE
Protein,UA: NEGATIVE
RBC, UA: NEGATIVE
Specific Gravity, UA: 1.006 (ref 1.005–1.030)
Urobilinogen, Ur: 0.2 mg/dL (ref 0.2–1.0)
pH, UA: 5.5 (ref 5.0–7.5)

## 2019-06-17 LAB — MICROSCOPIC EXAMINATION
Bacteria, UA: NONE SEEN
Casts: NONE SEEN /lpf
WBC, UA: NONE SEEN /hpf (ref 0–5)

## 2019-06-20 LAB — PAP IG AND HPV HIGH-RISK: HPV, high-risk: NEGATIVE

## 2019-06-22 ENCOUNTER — Telehealth: Payer: Self-pay

## 2019-06-22 NOTE — Telephone Encounter (Signed)
Per Nira Conn, let the patient know that her pap smear is normal

## 2019-06-22 NOTE — Progress Notes (Signed)
Please let the patient know that her pao smear is normal. Thanks.

## 2019-08-13 ENCOUNTER — Other Ambulatory Visit: Payer: Self-pay | Admitting: Nurse Practitioner

## 2019-08-14 LAB — COMPREHENSIVE METABOLIC PANEL WITH GFR
ALT: 6 IU/L (ref 0–32)
AST: 11 IU/L (ref 0–40)
Albumin/Globulin Ratio: 1.9 (ref 1.2–2.2)
Albumin: 4.3 g/dL (ref 3.8–4.8)
Alkaline Phosphatase: 76 IU/L (ref 39–117)
BUN/Creatinine Ratio: 18 (ref 9–23)
BUN: 15 mg/dL (ref 6–20)
Bilirubin Total: 0.3 mg/dL (ref 0.0–1.2)
CO2: 19 mmol/L — ABNORMAL LOW (ref 20–29)
Calcium: 9.2 mg/dL (ref 8.7–10.2)
Chloride: 108 mmol/L — ABNORMAL HIGH (ref 96–106)
Creatinine, Ser: 0.83 mg/dL (ref 0.57–1.00)
GFR calc Af Amer: 106 mL/min/1.73
GFR calc non Af Amer: 92 mL/min/1.73
Globulin, Total: 2.3 g/dL (ref 1.5–4.5)
Glucose: 92 mg/dL (ref 65–99)
Potassium: 4.4 mmol/L (ref 3.5–5.2)
Sodium: 142 mmol/L (ref 134–144)
Total Protein: 6.6 g/dL (ref 6.0–8.5)

## 2019-08-14 LAB — CBC
Hematocrit: 38.2 % (ref 34.0–46.6)
Hemoglobin: 12.8 g/dL (ref 11.1–15.9)
MCH: 30 pg (ref 26.6–33.0)
MCHC: 33.5 g/dL (ref 31.5–35.7)
MCV: 90 fL (ref 79–97)
Platelets: 360 x10E3/uL (ref 150–450)
RBC: 4.27 x10E6/uL (ref 3.77–5.28)
RDW: 12.4 % (ref 11.7–15.4)
WBC: 7.4 x10E3/uL (ref 3.4–10.8)

## 2019-08-14 LAB — LIPID PANEL W/O CHOL/HDL RATIO
Cholesterol, Total: 141 mg/dL (ref 100–199)
HDL: 46 mg/dL
LDL Chol Calc (NIH): 85 mg/dL (ref 0–99)
Triglycerides: 45 mg/dL (ref 0–149)
VLDL Cholesterol Cal: 10 mg/dL (ref 5–40)

## 2019-08-14 LAB — VITAMIN D 25 HYDROXY (VIT D DEFICIENCY, FRACTURES): Vit D, 25-Hydroxy: 28.2 ng/mL — ABNORMAL LOW (ref 30.0–100.0)

## 2019-08-14 LAB — T4, FREE: Free T4: 1.15 ng/dL (ref 0.82–1.77)

## 2019-08-14 LAB — TSH: TSH: 2.36 u[IU]/mL (ref 0.450–4.500)

## 2019-08-14 NOTE — Progress Notes (Signed)
Labs look good.

## 2019-08-17 ENCOUNTER — Other Ambulatory Visit: Payer: Self-pay

## 2019-08-17 DIAGNOSIS — Z20822 Contact with and (suspected) exposure to covid-19: Secondary | ICD-10-CM

## 2019-08-20 LAB — NOVEL CORONAVIRUS, NAA: SARS-CoV-2, NAA: NOT DETECTED

## 2019-10-12 ENCOUNTER — Emergency Department
Admission: EM | Admit: 2019-10-12 | Discharge: 2019-10-12 | Disposition: A | Discharge status: Home / Self Care | Payer: Worker's Compensation | Attending: Emergency Medicine | Admitting: Emergency Medicine

## 2019-10-12 ENCOUNTER — Other Ambulatory Visit: Payer: Self-pay

## 2019-10-12 DIAGNOSIS — S8992XA Unspecified injury of left lower leg, initial encounter: Secondary | ICD-10-CM | POA: Diagnosis present

## 2019-10-12 DIAGNOSIS — S81832A Puncture wound without foreign body, left lower leg, initial encounter: Secondary | ICD-10-CM | POA: Insufficient documentation

## 2019-10-12 DIAGNOSIS — Z87891 Personal history of nicotine dependence: Secondary | ICD-10-CM | POA: Insufficient documentation

## 2019-10-12 DIAGNOSIS — Z23 Encounter for immunization: Secondary | ICD-10-CM | POA: Diagnosis not present

## 2019-10-12 DIAGNOSIS — Y9389 Activity, other specified: Secondary | ICD-10-CM | POA: Insufficient documentation

## 2019-10-12 DIAGNOSIS — Y998 Other external cause status: Secondary | ICD-10-CM | POA: Diagnosis not present

## 2019-10-12 DIAGNOSIS — Z2914 Encounter for prophylactic rabies immune globin: Secondary | ICD-10-CM | POA: Diagnosis not present

## 2019-10-12 DIAGNOSIS — W540XXA Bitten by dog, initial encounter: Secondary | ICD-10-CM | POA: Insufficient documentation

## 2019-10-12 DIAGNOSIS — Y929 Unspecified place or not applicable: Secondary | ICD-10-CM | POA: Diagnosis not present

## 2019-10-12 DIAGNOSIS — Z203 Contact with and (suspected) exposure to rabies: Secondary | ICD-10-CM

## 2019-10-12 MED ORDER — RABIES IMMUNE GLOBULIN 150 UNIT/ML IM INJ
20.0000 [IU]/kg | INJECTION | Freq: Once | INTRAMUSCULAR | Status: AC
  Administered 2019-10-12: 2400 [IU] via INTRAMUSCULAR
  Filled 2019-10-12: qty 16

## 2019-10-12 MED ORDER — RABIES VACCINE, PCEC IM SUSR
1.0000 mL | Freq: Once | INTRAMUSCULAR | Status: AC
  Administered 2019-10-12: 15:00:00 1 mL via INTRAMUSCULAR
  Filled 2019-10-12: qty 1

## 2019-10-12 NOTE — ED Notes (Signed)
Pt states bit by a dog on Saturday. Pt states 1 small puncture to R leg, denies any complications with puncture wound healing. Pt states dog is currently detained by animal control. Pt states dog has never been vaccinated. Pt requesting information regarding rabies vaccination.

## 2019-10-12 NOTE — ED Triage Notes (Signed)
Pt states that she was bitten by a dog through her pants on Saturday. Broke the skin, pt states the bite has been reported and is here for possible rabies vaccinations

## 2019-10-12 NOTE — ED Notes (Signed)
NP reviewed D/C instructions with patient and  D/C patient at this time.

## 2019-10-12 NOTE — ED Notes (Signed)
Explained to patient awaiting pharmacy to bring meds up to do injections. Pt states understanding at this time.

## 2019-10-12 NOTE — ED Provider Notes (Signed)
[facility] Emergency Department Provider Note  ____________________________________________  Time seen: Approximately 2:12 PM  I have reviewed the triage vital signs and the nursing notes.   HISTORY  Chief Complaint Rabies Injection   HPI ICIE KUZNICKI is a 34 y.o. female presenting to the emergency department for treatment and evaluation after being bitten by dog 2 days ago.  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  A dog that was not on a leash ran around the side of the house and bit her right lower leg.  She has 1 puncture wound.  She cleaned the area well right after.  Animal control notified and have the dog in quarantine.  Dog was not vaccinated.  Patient was advised to come to the ER for rabies series.   Past Medical History:  Diagnosis Date  . Allergy     Patient Active Problem List   Diagnosis Date Noted  . Other fatigue 06/16/2019  . Irritant contact dermatitis due to plants, except food 04/26/2019  . Urinary tract infection without hematuria 02/11/2019  . Dysuria 02/11/2019  . Routine cervical smear 06/18/2018  . Flu vaccine need 06/18/2018  . Body mass index (BMI) 35.0-35.9, adult 06/18/2018  . Neck pain, musculoskeletal 03/23/2018    Past Surgical History:  Procedure Laterality Date  . TONSILLECTOMY      Prior to Admission medications   Medication Sig Start Date End Date Taking? Authorizing Provider  Cholecalciferol (VITAMIN D3) 1000 units CAPS Take by mouth.    [provider]  fexofenadine (ALLEGRA) 180 MG tablet Take 180 mg by mouth daily.    [provider]  ibuprofen (ADVIL) 200 MG tablet Take by mouth.    [provider]    Allergies Patient has no known allergies.  Family History  Problem Relation Age of Onset  . Arthritis/Rheumatoid Mother   . Diabetes Mother   . Hypertension Father     Social History Social History   Tobacco Use  . Smoking status: Former Research scientist (life sciences)   . Smokeless tobacco: Never Used  Substance Use Topics  . Alcohol use: Yes    Comment: socially  . Drug use: Never    Review of Systems  Constitutional: Negative for fever. Respiratory: Negative for cough or shortness of breath.  Musculoskeletal: Negative for myalgias Skin: Positive for puncture wound Neurological: Negative for numbness or paresthesias. ____________________________________________   PHYSICAL EXAM:  VITAL SIGNS: ED Triage Vitals  Enc Vitals Group     BP 10/12/19 1252 (!) 174/86     Pulse Rate 10/12/19 1252 88     Resp 10/12/19 1252 18     Temp 10/12/19 1252 98.9 F (37.2 C)     Temp Source 10/12/19 1252 Oral     SpO2 10/12/19 1252 100 %     Weight 10/12/19 1253 265 lb (120.2 kg)     Height 10/12/19 1253 6' (1.829 m)     Head Circumference --      Peak Flow --      Pain Score 10/12/19 1252 0     Pain Loc --      Pain Edu? --      Excl. in [place]? --      Constitutional: Well appearing. Eyes: Conjunctivae are clear without discharge or drainage. Nose: No rhinorrhea noted. Mouth/Throat: Airway is patent.  Neck: No stridor. Unrestricted range of motion observed. Cardiovascular: Capillary refill is <3 seconds.  Respiratory: Respirations are even and unlabored.. Musculoskeletal: Unrestricted range of motion  observed. Neurologic: Awake, alert, and oriented x 4.  Skin: Puncture wound noted to the right lateral lower extremity.  No lymphangitis or cellulitis  ____________________________________________   LABS (all labs ordered are listed, but only abnormal results are displayed)  Labs Reviewed - No data to display ____________________________________________  EKG  Not indicated. ____________________________________________  RADIOLOGY  Not indicated ____________________________________________   PROCEDURES  Procedures ____________________________________________   INITIAL IMPRESSION / ASSESSMENT AND PLAN / ED COURSE  EVERLEIGH COLCLASURE is a 34 y.o. female presents to the emergency department for treatment and evaluation after being bitten by an unvaccinated dog 2 days ago.  The wound appears that it is healing without any evidence of infection.  Today, she will be started on the rabies series. She is to follow up with primary care or return to the ER for symptoms of concern. She will also return for the remainder of the rabies series if unable to get them at [facility].   Medications  rabies vaccine (RABAVERT) injection 1 mL (has no administration in time range)  rabies immune globulin (HYPERAB/KEDRAB) injection 2,400 Units (has no administration in time range)     Pertinent labs & imaging results that were available during my care of the patient were reviewed by me and considered in my medical decision making (see chart for details).  ____________________________________________   FINAL CLINICAL IMPRESSION(S) / ED DIAGNOSES  Final diagnoses:  Need for post exposure prophylaxis for rabies    ED Discharge Orders    None       Note:  This document was prepared using Dragon voice recognition software and may include unintentional dictation errors.   Chinita Pester, FNP 10/12/19 1420    Dionne Bucy, MD 10/12/19 1453

## 2019-10-15 ENCOUNTER — Ambulatory Visit
Admission: EM | Admit: 2019-10-15 | Discharge: 2019-10-15 | Disposition: A | Discharge status: Home / Self Care | Payer: Worker's Compensation | Attending: Family Medicine | Admitting: Family Medicine

## 2019-10-15 MED ORDER — RABIES VACCINE, PCEC IM SUSR
1.0000 mL | Freq: Once | INTRAMUSCULAR | Status: AC
  Administered 2019-10-15: 17:00:00 1 mL via INTRAMUSCULAR

## 2019-10-15 NOTE — ED Triage Notes (Signed)
Patient here for Rabies Vaccine #2.

## 2019-10-19 ENCOUNTER — Other Ambulatory Visit: Payer: Self-pay

## 2019-10-19 ENCOUNTER — Ambulatory Visit
Admission: EM | Admit: 2019-10-19 | Discharge: 2019-10-19 | Disposition: A | Discharge status: Home / Self Care | Payer: Worker's Compensation | Attending: Emergency Medicine | Admitting: Emergency Medicine

## 2019-10-19 DIAGNOSIS — Z203 Contact with and (suspected) exposure to rabies: Secondary | ICD-10-CM | POA: Diagnosis not present

## 2019-10-19 DIAGNOSIS — Z23 Encounter for immunization: Secondary | ICD-10-CM

## 2019-10-19 MED ORDER — RABIES VACCINE, PCEC IM SUSR
1.0000 mL | Freq: Once | INTRAMUSCULAR | Status: AC
  Administered 2019-10-19: 17:00:00 1 mL via INTRAMUSCULAR

## 2019-10-19 NOTE — ED Triage Notes (Signed)
Patient is here for Day 7 Rabies Vaccine. Tolerated previous vaccines well.

## 2019-10-19 NOTE — Discharge Instructions (Signed)
Follow up in 1 week for Day 14 Rabies Injection.

## 2019-10-26 ENCOUNTER — Ambulatory Visit
Admission: EM | Admit: 2019-10-26 | Discharge: 2019-10-26 | Disposition: A | Discharge status: Home / Self Care | Payer: Worker's Compensation | Attending: Emergency Medicine | Admitting: Emergency Medicine

## 2019-10-26 DIAGNOSIS — Z23 Encounter for immunization: Secondary | ICD-10-CM | POA: Diagnosis not present

## 2019-10-26 DIAGNOSIS — Z203 Contact with and (suspected) exposure to rabies: Secondary | ICD-10-CM | POA: Diagnosis not present

## 2019-10-26 MED ORDER — RABIES VACCINE, PCEC IM SUSR
1.0000 mL | Freq: Once | INTRAMUSCULAR | Status: AC
  Administered 2019-10-26: 18:00:00 1 mL via INTRAMUSCULAR

## 2019-10-26 NOTE — ED Triage Notes (Signed)
Pt here for final rabies vaccine  

## 2019-12-16 ENCOUNTER — Telehealth: Payer: Self-pay

## 2019-12-16 NOTE — Telephone Encounter (Signed)
CONFIRMED AND SCREENED FOR 12-18-19 OV. 

## 2019-12-18 ENCOUNTER — Encounter: Payer: Self-pay | Admitting: Nurse Practitioner

## 2019-12-18 ENCOUNTER — Ambulatory Visit: Payer: Managed Care, Other (non HMO) | Admitting: Nurse Practitioner

## 2019-12-18 ENCOUNTER — Other Ambulatory Visit: Payer: Self-pay

## 2019-12-18 VITALS — BP 123/82 | HR 72 | Temp 97.0°F | Resp 16 | Ht 72.0 in | Wt 271.0 lb

## 2019-12-18 DIAGNOSIS — Z6836 Body mass index (BMI) 36.0-36.9, adult: Secondary | ICD-10-CM | POA: Diagnosis not present

## 2019-12-18 DIAGNOSIS — R5383 Other fatigue: Secondary | ICD-10-CM

## 2019-12-18 MED ORDER — PHENTERMINE HCL 37.5 MG PO TABS: 37.5000 mg | ORAL_TABLET | Freq: Every day | ORAL | 1 refills | Status: DC

## 2019-12-18 NOTE — Progress Notes (Signed)
[facility] [place], [place] [address]  Internal MEDICINE  Office Visit Note  Patient Name: Kaylee Rodgers  [id number]  [id number]  Date of Service: 12/27/2019  Chief Complaint  Patient presents with  . Follow-up    needs appetite suppressant     The patient is here for follow up visit. She is having trouble with increasing depression. Has been at home, working for over a year now. There is no prediction as to when or if she may return to the office. Currently, there are no plans for any employees to return to the building where they were working. She has been inactive these last several months. Gradually gaining weight.       Current Medication: Outpatient Encounter Medications as of 12/18/2019  Medication Sig  . Cholecalciferol (VITAMIN D3) 1000 units CAPS Take by mouth.  . fexofenadine (ALLEGRA) 180 MG tablet Take 180 mg by mouth daily.  Marland Kitchen ibuprofen (ADVIL) 200 MG tablet Take by mouth.  . phentermine (ADIPEX-P) 37.5 MG tablet Take 1 tablet (37.5 mg total) by mouth daily before breakfast.   No facility-administered encounter medications on file as of 12/18/2019.    Surgical History: Past Surgical History:  Procedure Laterality Date  . TONSILLECTOMY      Medical History: Past Medical History:  Diagnosis Date  . Allergy     Family History: Family History  Problem Relation Age of Onset  . Arthritis/Rheumatoid Mother   . Diabetes Mother   . Hypertension Father     Social History   Socioeconomic History  . Marital status: Single    Spouse name: Not on file  . Number of children: Not on file  . Years of education: Not on file  . Highest education level: Not on file  Occupational History  . Not on file  Tobacco Use  . Smoking status: Former Research scientist (life sciences)  . Smokeless tobacco: Never Used  Substance and Sexual Activity  . Alcohol use: Yes    Comment: socially  . Drug use: Never  . Sexual activity: Not on file  Other Topics Concern   . Not on file  Social History Narrative  . Not on file   Social Determinants of Health   Financial Resource Strain:   . Difficulty of Paying Living Expenses:   Food Insecurity:   . Worried About Charity fundraiser in the Last Year:   . Arboriculturist in the Last Year:   Transportation Needs:   . Film/video editor (Medical):   Marland Kitchen Lack of Transportation (Non-Medical):   Physical Activity:   . Days of Exercise per Week:   . Minutes of Exercise per Session:   Stress:   . Feeling of Stress :   Social Connections:   . Frequency of Communication with Friends and Family:   . Frequency of Social Gatherings with Friends and Family:   . Attends Religious Services:   . Active Member of Clubs or Organizations:   . Attends Archivist Meetings:   Marland Kitchen Marital Status:   Intimate Partner Violence:   . Fear of Current or Ex-Partner:   . Emotionally Abused:   Marland Kitchen Physically Abused:   . Sexually Abused:       Review of Systems  Constitutional: Negative for activity change, chills, fatigue and unexpected weight change.       Eleven pound weight gain since her last visit.   HENT: Negative for congestion, postnasal drip, rhinorrhea, sneezing and sore throat.  Respiratory: Negative for cough, chest tightness, shortness of breath and wheezing.   Cardiovascular: Negative for chest pain and palpitations.  Gastrointestinal: Negative for abdominal pain, constipation, diarrhea, nausea and vomiting.  Endocrine: Negative for cold intolerance, heat intolerance, polydipsia and polyuria.  Musculoskeletal: Negative for arthralgias, back pain, joint swelling and neck pain.  Skin: Negative for rash.  Allergic/Immunologic: Negative for environmental allergies.  Neurological: Negative for dizziness, tremors, numbness and headaches.  Hematological: Negative for adenopathy. Does not bruise/bleed easily.  Psychiatric/Behavioral: Negative for behavioral problems (Depression), sleep disturbance and  suicidal ideas. The patient is not nervous/anxious.     Today's Vitals   12/18/19 1537  BP: 123/82  Pulse: 72  Resp: 16  Temp: (!) 97 F (36.1 C)  SpO2: 99%  Weight: 271 lb (122.9 kg)  Height: 6' (1.829 m)   Body mass index is 36.75 kg/m.   Physical Exam Vitals and nursing note reviewed.  Constitutional:      General: She is not in acute distress.    Appearance: Normal appearance. She is well-developed. She is not diaphoretic.  HENT:     Head: Normocephalic and atraumatic.     Mouth/Throat:     Pharynx: No oropharyngeal exudate.  Eyes:     Pupils: Pupils are equal, round, and reactive to light.  Neck:     Thyroid: No thyromegaly.     Vascular: No JVD.     Trachea: No tracheal deviation.  Cardiovascular:     Rate and Rhythm: Normal rate and regular rhythm.     Heart sounds: Normal heart sounds. No murmur. No friction rub. No gallop.   Pulmonary:     Effort: Pulmonary effort is normal. No respiratory distress.     Breath sounds: Normal breath sounds. No wheezing or rales.  Chest:     Chest wall: No tenderness.  Abdominal:     Palpations: Abdomen is soft.  Musculoskeletal:        General: Normal range of motion.     Cervical back: Normal range of motion and neck supple.  Lymphadenopathy:     Cervical: No cervical adenopathy.  Skin:    General: Skin is warm and dry.  Neurological:     Mental Status: She is alert and oriented to person, place, and time.     Cranial Nerves: No cranial nerve deficit.  Psychiatric:        Mood and Affect: Mood normal.        Behavior: Behavior normal.        Thought Content: Thought content normal.        Judgment: Judgment normal.   Assessment/Plan: 1. Other fatigue Likely from increased stress and weight gain. Will monitor.   2. BMI 36.0-36.9,adult Will start phentermine 37..5mg  tablets daily. Recommend she limit her calorie intake to 1200/1500 calories per day ad incorporate regular physical activity into her daily routine.   - phentermine (ADIPEX-P) 37.5 MG tablet; Take 1 tablet (37.5 mg total) by mouth daily before breakfast.  Dispense: 30 tablet; Refill: 1  General Counseling: [name] verbalizes understanding of the findings of todays visit and agrees with plan of treatment. I have discussed any further diagnostic evaluation that may be needed or ordered today. We also reviewed her medications today. she has been encouraged to call the office with any questions or concerns that should arise related to todays visit.    There is a liability release in patients' chart. There has been a 10 minute discussion about the side effects including but  not limited to elevated blood pressure, anxiety, lack of sleep and dry mouth. Pt understands and will like to start/continue on appetite suppressant at this time. There will be one month RX given at the time of visit with proper follow up. Nova diet plan with restricted calories is given to the pt. Pt understands and agrees with  plan of treatment  This patient was seen by Vincent Gros FNP Collaboration with Dr Lyndon Code as a part of collaborative care agreement  Meds ordered this encounter  Medications  . phentermine (ADIPEX-P) 37.5 MG tablet    Sig: Take 1 tablet (37.5 mg total) by mouth daily before breakfast.    Dispense:  30 tablet    Refill:  1    Order Specific Question:   Supervising Provider    Answer:   Lyndon Code [[id number]]    Total time spent: 20 Minutes   Time spent includes review of chart, medications, test results, and follow up plan with the patient.      Dr Lyndon Code Internal medicine

## 2019-12-19 ENCOUNTER — Ambulatory Visit: Payer: Managed Care, Other (non HMO) | Attending: Internal Medicine

## 2019-12-19 DIAGNOSIS — Z23 Encounter for immunization: Secondary | ICD-10-CM

## 2019-12-19 NOTE — Progress Notes (Signed)
   Covid-19 Vaccination Clinic  Name:  MORGANA ROWLEY    MRN: [id number] DOB: Mar 04, 1986  12/19/2019  Ms. Cohoon was observed post Covid-19 immunization for 15 minutes without incident. She was provided with Vaccine Information Sheet and instruction to access the V-Safe system.   Ms. Kendrix was instructed to call 911 with any severe reactions post vaccine: Marland Kitchen Difficulty breathing  . Swelling of face and throat  . A fast heartbeat  . A bad rash all over body  . Dizziness and weakness   Immunizations Administered    Name Date Dose VIS Date Route   Pfizer COVID-19 Vaccine 12/19/2019  1:24 PM 0.3 mL 09/18/2019 Intramuscular   Manufacturer: [employer], [employer]   Lot: [id number]   NDC: [address]-5

## 2019-12-24 ENCOUNTER — Ambulatory Visit: Payer: Self-pay

## 2019-12-27 ENCOUNTER — Ambulatory Visit
Admission: EM | Admit: 2019-12-27 | Discharge: 2019-12-27 | Disposition: A | Discharge status: Home / Self Care | Payer: Managed Care, Other (non HMO) | Attending: Emergency Medicine | Admitting: Emergency Medicine

## 2019-12-27 ENCOUNTER — Other Ambulatory Visit: Payer: Self-pay

## 2019-12-27 ENCOUNTER — Encounter: Payer: Self-pay | Admitting: Emergency Medicine

## 2019-12-27 DIAGNOSIS — S39012A Strain of muscle, fascia and tendon of lower back, initial encounter: Secondary | ICD-10-CM

## 2019-12-27 LAB — URINALYSIS, COMPLETE (UACMP) WITH MICROSCOPIC
Bilirubin Urine: NEGATIVE
Glucose, UA: NEGATIVE mg/dL
Hgb urine dipstick: NEGATIVE
Ketones, ur: NEGATIVE mg/dL
Leukocytes,Ua: NEGATIVE
Nitrite: NEGATIVE
Protein, ur: NEGATIVE mg/dL
Specific Gravity, Urine: 1.01 (ref 1.005–1.030)
pH: 6.5 (ref 5.0–8.0)

## 2019-12-27 MED ORDER — MELOXICAM 15 MG PO TABS: 15.0000 mg | ORAL_TABLET | Freq: Every day | ORAL | 0 refills | Status: DC

## 2019-12-27 MED ORDER — METAXALONE 800 MG PO TABS: 800.0000 mg | ORAL_TABLET | Freq: Three times a day (TID) | ORAL | 0 refills | Status: DC

## 2019-12-27 NOTE — ED Triage Notes (Signed)
Patient c/o lower back and urinary frequency that started on Thursday.  Patient states that she recently started Phentermine on Wed.

## 2019-12-27 NOTE — Discharge Instructions (Addendum)
Apply ice 20 minutes out of every 2 hours 4-5 times daily for comfort. Use Caution while taking muscle relaxers.  Do not perform activities requiring concentration or judgment and do not drive.  Consider using salon poss patches on your lower back for increased pain relief.  Try to walk for 3 to 5 minutes every 20 minutes of sitting.

## 2019-12-27 NOTE — ED Provider Notes (Signed)
[facility]    CSN: [id number] Arrival date & time: 12/27/19  0802      History   Chief Complaint Chief Complaint  Patient presents with  . Back Pain  . Urinary Frequency    HPI Kaylee Rodgers is a 34 y.o. female.   HPI  34 year old female presents with lower back pain that is nonradicular and centered on both of her sacroiliac joint area.  He states that days she sits a lot at her job working from home for [employer].  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  States that her back hurt at the time but improved.  Is also recently started phentermine for weight loss.  In addition to above she has been experiencing frequent urinary frequency urgency but denies nausea vomiting fever chills.  He has no reports of vaginal discharge.        Past Medical History:  Diagnosis Date  . Allergy     Patient Active Problem List   Diagnosis Date Noted  . Other fatigue 06/16/2019  . Irritant contact dermatitis due to plants, except food 04/26/2019  . Urinary tract infection without hematuria 02/11/2019  . Dysuria 02/11/2019  . Routine cervical smear 06/18/2018  . Flu vaccine need 06/18/2018  . Body mass index (BMI) 35.0-35.9, adult 06/18/2018  . Neck pain, musculoskeletal 03/23/2018    Past Surgical History:  Procedure Laterality Date  . TONSILLECTOMY      OB History   No obstetric history on file.      Home Medications    Prior to Admission medications   Medication Sig Start Date End Date Taking? Authorizing Provider  cetirizine (ZYRTEC) 10 MG tablet Take 10 mg by mouth daily.   Yes [provider]  Cholecalciferol (VITAMIN D3) 1000 units CAPS Take by mouth.   Yes [provider]  phentermine (ADIPEX-P) 37.5 MG tablet Take 1 tablet (37.5 mg total) by mouth daily before breakfast. 12/18/19  Yes Boscia, Heather E, NP  fexofenadine (ALLEGRA) 180 MG tablet  Take 180 mg by mouth daily.    [provider]  ibuprofen (ADVIL) 200 MG tablet Take by mouth.    [provider]  meloxicam (MOBIC) 15 MG tablet Take 1 tablet (15 mg total) by mouth daily. Take with food. 12/27/19   Lutricia Feil, PA-C  metaxalone (SKELAXIN) 800 MG tablet Take 1 tablet (800 mg total) by mouth 3 (three) times daily. 12/27/19   Lutricia Feil, PA-C    Family History Family History  Problem Relation Age of Onset  . Arthritis/Rheumatoid Mother   . Diabetes Mother   . Hypertension Father     Social History Social History   Tobacco Use  . Smoking status: Former Games developer  . Smokeless tobacco: Never Used  Substance Use Topics  . Alcohol use: Yes    Comment: socially  . Drug use: Never     Allergies   Patient has no known allergies.   Review of Systems Review of Systems  Constitutional: Positive for activity change. Negative for appetite change, chills, diaphoresis, fatigue and fever.  Genitourinary: Positive for frequency and urgency.  Musculoskeletal: Positive for back pain.  All other systems reviewed and are negative.    Physical Exam Triage Vital Signs ED Triage Vitals  Enc Vitals Group     BP 12/27/19 0818 118/85     Pulse Rate 12/27/19 0818 Marland Kitchen)  103     Resp 12/27/19 0818 14     Temp 12/27/19 0818 98.3 F (36.8 C)     Temp Source 12/27/19 0818 Oral     SpO2 12/27/19 0818 100 %     Weight 12/27/19 0815 275 lb (124.7 kg)     Height 12/27/19 0815 6' (1.829 m)     Head Circumference --      Peak Flow --      Pain Score 12/27/19 0815 3     Pain Loc --      Pain Edu? --      Excl. in [place]? --    No data found.  Updated Vital Signs BP 118/85 (BP Location: Left Arm)   Pulse (!) 103   Temp 98.3 F (36.8 C) (Oral)   Resp 14   Ht 6' (1.829 m)   Wt 275 lb (124.7 kg)   LMP 12/15/2019 (Approximate)   SpO2 100%   BMI 37.30 kg/m   Visual Acuity Right Eye Distance:   Left Eye Distance:   Bilateral Distance:    Right Eye  Near:   Left Eye Near:    Bilateral Near:     Physical Exam Vitals and nursing note reviewed. Exam conducted with a chaperone present.  Constitutional:      General: She is not in acute distress.    Appearance: Normal appearance. She is obese. She is not ill-appearing or toxic-appearing.  HENT:     Head: Normocephalic and atraumatic.  Eyes:     Conjunctiva/sclera: Conjunctivae normal.  Musculoskeletal:        General: Tenderness present. Normal range of motion.     Cervical back: Normal range of motion and neck supple.     Comments: Examination of the lumbar spine was performed with Nira Conn, RN as Education officer, museum.  Patient has a level pelvis in stance.  She is a tender of the superior sacroiliac joints and in the paraspinous musculature at the L5-S1 level.  Able to forward flex with her hands level of her knees and return to upright posture without much difficulty.  Lateral flexion is more painful to the right than to the left.  However maximum tenderness is more noticeable on the left than the right.  To toe and heel walk normally.  DTRs are 1+/4 but symmetrical.  Straight leg raise testing in the seated position is negative at 90 degrees bilaterally.  Skin:    General: Skin is warm and dry.  Neurological:     General: No focal deficit present.     Mental Status: She is alert and oriented to person, place, and time.  Psychiatric:        Mood and Affect: Mood normal.        Behavior: Behavior normal.        Thought Content: Thought content normal.        Judgment: Judgment normal.      UC Treatments / Results  Labs (all labs ordered are listed, but only abnormal results are displayed) Labs Reviewed  URINALYSIS, COMPLETE (UACMP) WITH MICROSCOPIC - Abnormal; Notable for the following components:      Result Value   Bacteria, UA FEW (*)    All other components within normal limits    EKG   Radiology No results found.  Procedures Procedures (including critical care  time)  Medications Ordered in UC Medications - No data to display  Initial Impression / Assessment and Plan / UC Course  I have  reviewed the triage vital signs and the nursing notes.  Pertinent labs & imaging results that were available during my care of the patient were reviewed by me and considered in my medical decision making (see chart for details).   34 year old female presented with a 2 cc sets of symptoms.  Is been nonradiating lower back pain indicates the sacroiliac area is.  Has history of sitting for long periods of time working at home on a computer for [employer].  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  She also had performed weed eating on a 1 1/2 acre lot that caused her to have back pain which improved initially but has since gotten worse.  Second complaint was that of urinary frequency and urgency.  She states that she has been getting up more at nighttime having to go to the bathroom.  Not specifically complain of dysuria and has not had any of vaginal discharge.  She denies any nausea vomiting fever chills.  I reviewed the urinalysis with her with her which was normal.  Physical exam was consistent with a lumbar sacral strain.  Since she was not complaining of dysuria and had no vaginal discharge there was no indication at this time to have her perform self swabbing for wet prep.  We talked of ergonomics during her workday and I have encouraged her to walk 3 to 5 minutes every 20 minutes for every hour she sits at the computer.  Same would go for the driving time.  Place her on Mobic 30mg  daily with food.  She will be cautious of her blood pressure due to the phentermine.  I have also given her prescription for Skelaxin for use when she is not performing activities requiring concentration and not when she is driving.  Ice her back as necessary.  If she is not improving she should follow-up with her primary care physician.   Final Clinical Impressions(s) / UC Diagnoses     Final diagnoses:  Strain of lumbar region, initial encounter     Discharge Instructions     Apply ice 20 minutes out of every 2 hours 4-5 times daily for comfort. Use Caution while taking muscle relaxers.  Do not perform activities requiring concentration or judgment and do not drive.  Consider using salon poss patches on your lower back for increased pain relief.  Try to walk for 3 to 5 minutes every 20 minutes of sitting.    ED Prescriptions    Medication Sig Dispense Auth. Provider   meloxicam (MOBIC) 15 MG tablet Take 1 tablet (15 mg total) by mouth daily. Take with food. 30 tablet , PA-C   metaxalone (SKELAXIN) 800 MG tablet Take 1 tablet (800 mg total) by mouth 3 (three) times daily. 21 tablet Lutricia Feil, PA-C     PDMP not reviewed this encounter.   Lutricia Feil, PA-C 12/27/19 [phone]

## 2020-01-12 ENCOUNTER — Ambulatory Visit: Payer: Managed Care, Other (non HMO) | Attending: Internal Medicine

## 2020-01-12 DIAGNOSIS — Z23 Encounter for immunization: Secondary | ICD-10-CM

## 2020-01-12 NOTE — Progress Notes (Signed)
   Covid-19 Vaccination Clinic  Name:  Kaylee Rodgers    MRN: [id number] DOB: 1986/03/11  01/12/2020  [name] was observed post Covid-19 immunization for 15 minutes without incident. She was provided with Vaccine Information Sheet and instruction to access the V-Safe system.   [name] was instructed to call 911 with any severe reactions post vaccine: Marland Kitchen Difficulty breathing  . Swelling of face and throat  . A fast heartbeat  . A bad rash all over body  . Dizziness and weakness   Immunizations Administered    Name Date Dose VIS Date Route   Pfizer COVID-19 Vaccine 01/12/2020  3:47 PM 0.3 mL 09/18/2019 Intramuscular   Manufacturer: [employer], [employer]   Lot: [id number]   NDC: [address]-4

## 2020-01-20 ENCOUNTER — Telehealth: Payer: Self-pay

## 2020-01-20 NOTE — Telephone Encounter (Signed)
Confirmed and screened for 01-22-20 ov. 

## 2020-01-22 ENCOUNTER — Encounter: Payer: Self-pay | Admitting: Nurse Practitioner

## 2020-01-22 ENCOUNTER — Other Ambulatory Visit: Payer: Self-pay

## 2020-01-22 ENCOUNTER — Ambulatory Visit: Payer: Managed Care, Other (non HMO) | Admitting: Nurse Practitioner

## 2020-01-22 VITALS — BP 123/84 | HR 84 | Temp 97.7°F | Resp 16 | Ht 72.0 in | Wt 259.0 lb

## 2020-01-22 DIAGNOSIS — Z6836 Body mass index (BMI) 36.0-36.9, adult: Secondary | ICD-10-CM | POA: Diagnosis not present

## 2020-01-22 DIAGNOSIS — R5383 Other fatigue: Secondary | ICD-10-CM | POA: Diagnosis not present

## 2020-01-22 MED ORDER — PHENTERMINE HCL 37.5 MG PO TABS: 37.5000 mg | ORAL_TABLET | Freq: Every day | ORAL | 1 refills | Status: DC

## 2020-01-22 NOTE — Progress Notes (Signed)
[facility] [place], [place] [address]  Internal MEDICINE  Office Visit Note  Patient Name: Kaylee Rodgers  [id number]  [id number]  Date of Service: 02/03/2020  Chief Complaint  Patient presents with  . Medical Management of Chronic Issues    weight management     The patient is here for follow up regarding weight management. She was started on phentermine at her last visit. She has restricted her calorie intake to 1500 calories and she is exercising more frequently. She has lost 12 pounds since she was last seen. She has done metabolic test today. A 1500-1800 calorie diet is recommended. She has a slightly faster than normal metabolism.       Current Medication: Outpatient Encounter Medications as of 01/22/2020  Medication Sig  . cetirizine (ZYRTEC) 10 MG tablet Take 10 mg by mouth daily.  . Cholecalciferol (VITAMIN D3) 1000 units CAPS Take by mouth.  . fexofenadine (ALLEGRA) 180 MG tablet Take 180 mg by mouth daily.  Marland Kitchen ibuprofen (ADVIL) 200 MG tablet Take by mouth.  . meloxicam (MOBIC) 15 MG tablet Take 1 tablet (15 mg total) by mouth daily. Take with food.  . metaxalone (SKELAXIN) 800 MG tablet Take 1 tablet (800 mg total) by mouth 3 (three) times daily.  . phentermine (ADIPEX-P) 37.5 MG tablet Take 1 tablet (37.5 mg total) by mouth daily before breakfast.  . [DISCONTINUED] phentermine (ADIPEX-P) 37.5 MG tablet Take 1 tablet (37.5 mg total) by mouth daily before breakfast.   No facility-administered encounter medications on file as of 01/22/2020.    Surgical History: Past Surgical History:  Procedure Laterality Date  . TONSILLECTOMY      Medical History: Past Medical History:  Diagnosis Date  . Allergy     Family History: Family History  Problem Relation Age of Onset  . Arthritis/Rheumatoid Mother   . Diabetes Mother   . Hypertension Father     Social History   Socioeconomic History  . Marital status: Single    Spouse name:  Not on file  . Number of children: Not on file  . Years of education: Not on file  . Highest education level: Not on file  Occupational History  . Not on file  Tobacco Use  . Smoking status: Former Research scientist (life sciences)  . Smokeless tobacco: Never Used  Substance and Sexual Activity  . Alcohol use: Yes    Comment: socially  . Drug use: Never  . Sexual activity: Not on file  Other Topics Concern  . Not on file  Social History Narrative  . Not on file   Social Determinants of Health   Financial Resource Strain:   . Difficulty of Paying Living Expenses:   Food Insecurity:   . Worried About Charity fundraiser in the Last Year:   . Arboriculturist in the Last Year:   Transportation Needs:   . Film/video editor (Medical):   Marland Kitchen Lack of Transportation (Non-Medical):   Physical Activity:   . Days of Exercise per Week:   . Minutes of Exercise per Session:   Stress:   . Feeling of Stress :   Social Connections:   . Frequency of Communication with Friends and Family:   . Frequency of Social Gatherings with Friends and Family:   . Attends Religious Services:   . Active Member of Clubs or Organizations:   . Attends Archivist Meetings:   Marland Kitchen Marital Status:   Intimate Partner Violence:   .  Fear of Current or Ex-Partner:   . Emotionally Abused:   Marland Kitchen Physically Abused:   . Sexually Abused:       Review of Systems  Constitutional: Negative for activity change, chills, fatigue and unexpected weight change.       Twelve pound weight loss since her last visit.   HENT: Negative for congestion, postnasal drip, rhinorrhea, sneezing and sore throat.   Respiratory: Negative for cough, chest tightness, shortness of breath and wheezing.   Cardiovascular: Negative for chest pain and palpitations.  Gastrointestinal: Negative for abdominal pain, constipation, diarrhea, nausea and vomiting.  Endocrine: Negative for cold intolerance, heat intolerance, polydipsia and polyuria.  Musculoskeletal:  Negative for arthralgias, back pain, joint swelling and neck pain.  Skin: Negative for rash.  Allergic/Immunologic: Negative for environmental allergies.  Neurological: Negative for dizziness, tremors, numbness and headaches.  Hematological: Negative for adenopathy. Does not bruise/bleed easily.  Psychiatric/Behavioral: Negative for behavioral problems (Depression), sleep disturbance and suicidal ideas. The patient is not nervous/anxious.    Today's Vitals   01/22/20 1555  BP: 123/84  Pulse: 84  Resp: 16  Temp: 97.7 F (36.5 C)  SpO2: 99%  Weight: 259 lb (117.5 kg)  Height: 6' (1.829 m)   Body mass index is 35.13 kg/m.  Physical Exam Vitals and nursing note reviewed.  Constitutional:      General: She is not in acute distress.    Appearance: Normal appearance. She is well-developed. She is not diaphoretic.  HENT:     Head: Normocephalic and atraumatic.     Mouth/Throat:     Pharynx: No oropharyngeal exudate.  Eyes:     Pupils: Pupils are equal, round, and reactive to light.  Neck:     Thyroid: No thyromegaly.     Vascular: No JVD.     Trachea: No tracheal deviation.  Cardiovascular:     Rate and Rhythm: Normal rate and regular rhythm.     Heart sounds: Normal heart sounds. No murmur. No friction rub. No gallop.   Pulmonary:     Effort: Pulmonary effort is normal. No respiratory distress.     Breath sounds: Normal breath sounds. No wheezing or rales.  Chest:     Chest wall: No tenderness.  Abdominal:     Palpations: Abdomen is soft.  Musculoskeletal:        General: Normal range of motion.     Cervical back: Normal range of motion and neck supple.  Lymphadenopathy:     Cervical: No cervical adenopathy.  Skin:    General: Skin is warm and dry.  Neurological:     Mental Status: She is alert and oriented to person, place, and time.     Cranial Nerves: No cranial nerve deficit.  Psychiatric:        Mood and Affect: Mood normal.        Behavior: Behavior normal.         Thought Content: Thought content normal.        Judgment: Judgment normal.   Assessment/Plan: 1. Other fatigue Improving with weight loss. Continue to monitor.   2. BMI 36.0-36.9,adult She has done metabolic test today. A 1500-1800 calorie diet is recommended. She has a slightly faster than normal metabolism. She may continue to take phentermine daily. conitnue with low calorie diet and increased exercise.  - Metabolic Test - phentermine (ADIPEX-P) 37.5 MG tablet; Take 1 tablet (37.5 mg total) by mouth daily before breakfast.  Dispense: 30 tablet; Refill: 1  General Counseling: Hadlea verbalizes  understanding of the findings of todays visit and agrees with plan of treatment. I have discussed any further diagnostic evaluation that may be needed or ordered today. We also reviewed her medications today. she has been encouraged to call the office with any questions or concerns that should arise related to todays visit.   There is a liability release in patients' chart. There has been a 10 minute discussion about the side effects including but not limited to elevated blood pressure, anxiety, lack of sleep and dry mouth. Pt understands and will like to start/continue on appetite suppressant at this time. There will be one month RX given at the time of visit with proper follow up. Nova diet plan with restricted calories is given to the pt. Pt understands and agrees with  plan of treatment   Obesity Counseling: Risk Assessment: An assessment of behavioral risk factors was made today and includes lack of exercise sedentary lifestyle, lack of portion control and poor dietary habits.  Risk Modification Advice: She was counseled on portion control guidelines. Restricting daily caloric intake to 1500 calories per day The detrimental long term effects of obesity on her health and ongoing poor compliance was also discussed with the patient.    This patient was seen by Vincent Gros FNP  Collaboration with Dr Lyndon Code as a part of collaborative care agreement  Orders Placed This Encounter  Procedures  . Metabolic Test    Meds ordered this encounter  Medications  . phentermine (ADIPEX-P) 37.5 MG tablet    Sig: Take 1 tablet (37.5 mg total) by mouth daily before breakfast.    Dispense:  30 tablet    Refill:  1    Order Specific Question:   Supervising Provider    Answer:   Lyndon Code [[id number]]    Total time spent: 30  Minutes   Time spent includes review of chart, medications, test results, and follow up plan with the patient.      Dr Lyndon Code Internal medicine

## 2020-02-11 ENCOUNTER — Telehealth: Payer: Self-pay

## 2020-02-11 NOTE — Telephone Encounter (Signed)
Pt called she had sinus infection to make sure which OTC med she can take with phentermine advised her we can she her video visit tomorrow or she can go to urgent care and also ask phar for OTC med they can check for her that not interact with her med

## 2020-02-17 ENCOUNTER — Encounter: Payer: Self-pay | Admitting: Adult Health

## 2020-02-17 ENCOUNTER — Ambulatory Visit: Payer: Managed Care, Other (non HMO) | Admitting: Adult Health

## 2020-02-17 VITALS — Temp 97.9°F | Resp 16 | Ht 72.0 in | Wt 259.0 lb

## 2020-02-17 DIAGNOSIS — B373 Candidiasis of vulva and vagina: Secondary | ICD-10-CM | POA: Diagnosis not present

## 2020-02-17 DIAGNOSIS — J0100 Acute maxillary sinusitis, unspecified: Secondary | ICD-10-CM

## 2020-02-17 DIAGNOSIS — B3731 Acute candidiasis of vulva and vagina: Secondary | ICD-10-CM

## 2020-02-17 MED ORDER — AMOXICILLIN-POT CLAVULANATE 875-125 MG PO TABS: 1.0000 | ORAL_TABLET | Freq: Two times a day (BID) | ORAL | 0 refills | Status: DC

## 2020-02-17 MED ORDER — FLUCONAZOLE 150 MG PO TABS: 150.0000 mg | ORAL_TABLET | ORAL | 0 refills | Status: DC | PRN

## 2020-02-17 NOTE — Progress Notes (Signed)
[facility] [address] [place], [place] [address]  Internal MEDICINE  Telephone Visit  Patient Name: Kaylee Rodgers  [id number]  [id number]  Date of Service: 02/17/2020  I connected with the patient at 923 by telephone and verified the patients identity using two identifiers.   I discussed the limitations, risks, security and privacy concerns of performing an evaluation and management service by telephone and the availability of in person appointments. I also discussed with the patient that there may be a patient responsible charge related to the service.  The patient expressed understanding and agrees to proceed.    Chief Complaint  Patient presents with  . Acute Visit    possible sinus infection    HPI  Pt is seen via telephone. She reports 6 days ago she began to feel bad.  She had runny nose and sinus pressure and congestion.  She did MD live, and they prescribed her nose spray, and told her to take mucinex sinus.  She now had green nasal drainage.  She is coughing.  She denies fever, but had chills at times. She is scheduled for rapid covid test this afternoon, she did complete her vaccine about a month ago.  No one else is sick in her family. She was around a large family over the last week, due to a funeral.    Current Medication: Outpatient Encounter Medications as of 02/17/2020  Medication Sig  . cetirizine (ZYRTEC) 10 MG tablet Take 10 mg by mouth daily.  . Cholecalciferol (VITAMIN D3) 1000 units CAPS Take by mouth.  . fexofenadine (ALLEGRA) 180 MG tablet Take 180 mg by mouth daily.  Marland Kitchen ibuprofen (ADVIL) 200 MG tablet Take by mouth.  . meloxicam (MOBIC) 15 MG tablet Take 1 tablet (15 mg total) by mouth daily. Take with food.  . metaxalone (SKELAXIN) 800 MG tablet Take 1 tablet (800 mg total) by mouth 3 (three) times daily.  . phentermine (ADIPEX-P) 37.5 MG tablet Take 1 tablet (37.5 mg total) by mouth daily before breakfast.  . amoxicillin-clavulanate  (AUGMENTIN) 875-125 MG tablet Take 1 tablet by mouth 2 (two) times daily.  . fluconazole (DIFLUCAN) 150 MG tablet Take 1 tablet (150 mg total) by mouth every three (3) days as needed.   No facility-administered encounter medications on file as of 02/17/2020.    Surgical History: Past Surgical History:  Procedure Laterality Date  . TONSILLECTOMY      Medical History: Past Medical History:  Diagnosis Date  . Allergy     Family History: Family History  Problem Relation Age of Onset  . Arthritis/Rheumatoid Mother   . Diabetes Mother   . Hypertension Father     Social History   Socioeconomic History  . Marital status: Single    Spouse name: Not on file  . Number of children: Not on file  . Years of education: Not on file  . Highest education level: Not on file  Occupational History  . Not on file  Tobacco Use  . Smoking status: Former Games developer  . Smokeless tobacco: Never Used  Substance and Sexual Activity  . Alcohol use: Yes    Comment: socially  . Drug use: Never  . Sexual activity: Not on file  Other Topics Concern  . Not on file  Social History Narrative  . Not on file   Social Determinants of Health   Financial Resource Strain:   . Difficulty of Paying Living Expenses:   Food Insecurity:   . Worried About Running  Out of Food in the Last Year:   . Seat Pleasant in the Last Year:   Transportation Needs:   . Lack of Transportation (Medical):   Marland Kitchen Lack of Transportation (Non-Medical):   Physical Activity:   . Days of Exercise per Week:   . Minutes of Exercise per Session:   Stress:   . Feeling of Stress :   Social Connections:   . Frequency of Communication with Friends and Family:   . Frequency of Social Gatherings with Friends and Family:   . Attends Religious Services:   . Active Member of Clubs or Organizations:   . Attends Archivist Meetings:   Marland Kitchen Marital Status:   Intimate Partner Violence:   . Fear of Current or Ex-Partner:   .  Emotionally Abused:   Marland Kitchen Physically Abused:   . Sexually Abused:       Review of Systems  Constitutional: Negative for chills, fatigue and unexpected weight change.  HENT: Negative for congestion, rhinorrhea, sneezing and sore throat.   Eyes: Negative for photophobia, pain and redness.  Respiratory: Negative for cough, chest tightness and shortness of breath.   Cardiovascular: Negative for chest pain and palpitations.  Gastrointestinal: Negative for abdominal pain, constipation, diarrhea, nausea and vomiting.  Endocrine: Negative.   Genitourinary: Negative for dysuria and frequency.  Musculoskeletal: Negative for arthralgias, back pain, joint swelling and neck pain.  Skin: Negative for rash.  Allergic/Immunologic: Negative.   Neurological: Negative for tremors and numbness.  Hematological: Negative for adenopathy. Does not bruise/bleed easily.  Psychiatric/Behavioral: Negative for behavioral problems and sleep disturbance. The patient is not nervous/anxious.     Vital Signs: Temp 97.9 F (36.6 C)   Resp 16   Ht 6' (1.829 m)   Wt 259 lb (117.5 kg)   BMI 35.13 kg/m    Observation/Objective:  Well sounding, nad noted   Assessment/Plan: 1. Acute maxillary sinusitis, recurrence not specified Advised patient to take entire course of antibiotics as prescribed with food. Pt should return to clinic in 7-10 days if symptoms fail to improve or new symptoms develop.  - amoxicillin-clavulanate (AUGMENTIN) 875-125 MG tablet; Take 1 tablet by mouth 2 (two) times daily.  Dispense: 20 tablet; Refill: 0  2. Vaginal candida Use Diflucan as directed. - fluconazole (DIFLUCAN) 150 MG tablet; Take 1 tablet (150 mg total) by mouth every three (3) days as needed.  Dispense: 3 tablet; Refill: 0  General Counseling: Kaylee Rodgers verbalizes understanding of the findings of today's phone visit and agrees with plan of treatment. I have discussed any further diagnostic evaluation that may be needed or  ordered today. We also reviewed her medications today. she has been encouraged to call the office with any questions or concerns that should arise related to todays visit.    No orders of the defined types were placed in this encounter.   Meds ordered this encounter  Medications  . amoxicillin-clavulanate (AUGMENTIN) 875-125 MG tablet    Sig: Take 1 tablet by mouth 2 (two) times daily.    Dispense:  20 tablet    Refill:  0  . fluconazole (DIFLUCAN) 150 MG tablet    Sig: Take 1 tablet (150 mg total) by mouth every three (3) days as needed.    Dispense:  3 tablet    Refill:  0    Time spent: 20 Minutes   This patient was seen by Orson Gear AGNP-C in Collaboration with Dr Lavera Guise as a part of collaborative care agreement  Internal medicine

## 2020-03-02 ENCOUNTER — Telehealth: Payer: Self-pay

## 2020-03-02 NOTE — Telephone Encounter (Signed)
Confirmed and screened for 03-04-20 ov. 

## 2020-03-04 ENCOUNTER — Other Ambulatory Visit: Payer: Self-pay

## 2020-03-04 ENCOUNTER — Encounter: Payer: Self-pay | Admitting: Nurse Practitioner

## 2020-03-04 ENCOUNTER — Ambulatory Visit: Payer: Managed Care, Other (non HMO) | Admitting: Nurse Practitioner

## 2020-03-04 VITALS — BP 126/81 | HR 100 | Temp 96.9°F | Resp 16 | Ht 72.0 in | Wt 254.8 lb

## 2020-03-04 DIAGNOSIS — Z6834 Body mass index (BMI) 34.0-34.9, adult: Secondary | ICD-10-CM | POA: Diagnosis not present

## 2020-03-04 DIAGNOSIS — R5383 Other fatigue: Secondary | ICD-10-CM

## 2020-03-04 MED ORDER — PHENTERMINE HCL 37.5 MG PO TABS: 37.5000 mg | ORAL_TABLET | Freq: Every day | ORAL | 1 refills | Status: DC

## 2020-03-04 NOTE — Progress Notes (Signed)
[facility] [place], [place] [address]  Internal MEDICINE  Office Visit Note  Patient Name: Kaylee Rodgers  [id number]  [id number]  Date of Service: 03/09/2020  Chief Complaint  Patient presents with  . Follow-up    weight management    The patient is here for follow up of weight management. She is taking phentermine daily. Does not feel as though she did well this month. Her grandmother passed away. She really did not exercise. Ate a lot of comfort food, stayed with her grandfather frequently. Is now trying to get back on schedule with weight management. She has lost 5 pounds since her last visit. She has no negative side effects associated with taking this medication.  Goal for next visit is 246 at next visit       Current Medication: Outpatient Encounter Medications as of 03/04/2020  Medication Sig  . cetirizine (ZYRTEC) 10 MG tablet Take 10 mg by mouth daily.  . Cholecalciferol (VITAMIN D3) 1000 units CAPS Take by mouth.  . fexofenadine (ALLEGRA) 180 MG tablet Take 180 mg by mouth daily.  . fluconazole (DIFLUCAN) 150 MG tablet Take 1 tablet (150 mg total) by mouth every three (3) days as needed.  Marland Kitchen ibuprofen (ADVIL) 200 MG tablet Take by mouth.  . meloxicam (MOBIC) 15 MG tablet Take 1 tablet (15 mg total) by mouth daily. Take with food.  . metaxalone (SKELAXIN) 800 MG tablet Take 1 tablet (800 mg total) by mouth 3 (three) times daily.  . phentermine (ADIPEX-P) 37.5 MG tablet Take 1 tablet (37.5 mg total) by mouth daily before breakfast.  . [DISCONTINUED] amoxicillin-clavulanate (AUGMENTIN) 875-125 MG tablet Take 1 tablet by mouth 2 (two) times daily.  . [DISCONTINUED] phentermine (ADIPEX-P) 37.5 MG tablet Take 1 tablet (37.5 mg total) by mouth daily before breakfast.   No facility-administered encounter medications on file as of 03/04/2020.    Surgical History: Past Surgical History:  Procedure Laterality Date  . TONSILLECTOMY      Medical  History: Past Medical History:  Diagnosis Date  . Allergy     Family History: Family History  Problem Relation Age of Onset  . Arthritis/Rheumatoid Mother   . Diabetes Mother   . Hypertension Father     Social History   Socioeconomic History  . Marital status: Single    Spouse name: Not on file  . Number of children: Not on file  . Years of education: Not on file  . Highest education level: Not on file  Occupational History  . Not on file  Tobacco Use  . Smoking status: Former Research scientist (life sciences)  . Smokeless tobacco: Never Used  Substance and Sexual Activity  . Alcohol use: Yes    Comment: socially  . Drug use: Never  . Sexual activity: Not on file  Other Topics Concern  . Not on file  Social History Narrative  . Not on file   Social Determinants of Health   Financial Resource Strain:   . Difficulty of Paying Living Expenses:   Food Insecurity:   . Worried About Charity fundraiser in the Last Year:   . Arboriculturist in the Last Year:   Transportation Needs:   . Film/video editor (Medical):   Marland Kitchen Lack of Transportation (Non-Medical):   Physical Activity:   . Days of Exercise per Week:   . Minutes of Exercise per Session:   Stress:   . Feeling of Stress :   Social Connections:   .  Frequency of Communication with Friends and Family:   . Frequency of Social Gatherings with Friends and Family:   . Attends Religious Services:   . Active Member of Clubs or Organizations:   . Attends Banker Meetings:   Marland Kitchen Marital Status:   Intimate Partner Violence:   . Fear of Current or Ex-Partner:   . Emotionally Abused:   Marland Kitchen Physically Abused:   . Sexually Abused:       Review of Systems  Constitutional: Negative for activity change, chills, fatigue and unexpected weight change.       Five pound weight loss since last visit.   HENT: Negative for congestion, postnasal drip, rhinorrhea, sneezing and sore throat.   Respiratory: Negative for cough, chest  tightness, shortness of breath and wheezing.   Cardiovascular: Negative for chest pain and palpitations.  Gastrointestinal: Negative for abdominal pain, constipation, diarrhea, nausea and vomiting.  Endocrine: Negative for cold intolerance, heat intolerance, polydipsia and polyuria.  Musculoskeletal: Negative for arthralgias, back pain, joint swelling and neck pain.  Skin: Negative for rash.  Allergic/Immunologic: Negative for environmental allergies.  Neurological: Negative for dizziness, tremors, numbness and headaches.  Hematological: Negative for adenopathy. Does not bruise/bleed easily.  Psychiatric/Behavioral: Negative for behavioral problems (Depression), sleep disturbance and suicidal ideas. The patient is not nervous/anxious.    Today's Vitals   03/04/20 1548  BP: 126/81  Pulse: 100  Resp: 16  Temp: (!) 96.9 F (36.1 C)  SpO2: 97%  Weight: 254 lb 12.8 oz (115.6 kg)  Height: 6' (1.829 m)   Body mass index is 34.56 kg/m.  Physical Exam Vitals and nursing note reviewed.  Constitutional:      General: She is not in acute distress.    Appearance: Normal appearance. She is well-developed. She is not diaphoretic.  HENT:     Head: Normocephalic and atraumatic.     Mouth/Throat:     Pharynx: No oropharyngeal exudate.  Eyes:     Pupils: Pupils are equal, round, and reactive to light.  Neck:     Thyroid: No thyromegaly.     Vascular: No JVD.     Trachea: No tracheal deviation.  Cardiovascular:     Rate and Rhythm: Normal rate and regular rhythm.     Heart sounds: Normal heart sounds. No murmur. No friction rub. No gallop.   Pulmonary:     Effort: Pulmonary effort is normal. No respiratory distress.     Breath sounds: Normal breath sounds. No wheezing or rales.  Chest:     Chest wall: No tenderness.  Abdominal:     Palpations: Abdomen is soft.  Musculoskeletal:        General: Normal range of motion.     Cervical back: Normal range of motion and neck supple.   Lymphadenopathy:     Cervical: No cervical adenopathy.  Skin:    General: Skin is warm and dry.  Neurological:     Mental Status: She is alert and oriented to person, place, and time.     Cranial Nerves: No cranial nerve deficit.  Psychiatric:        Mood and Affect: Mood normal.        Behavior: Behavior normal.        Thought Content: Thought content normal.        Judgment: Judgment normal.   Assessment/Plan: 1. Other fatigue Improving as weight loss continues   2. BMI 34.0-34.9,adult Continues to improve. May continue phentermine 37.5mg  tablets daily. Limit calorie intake to  1200 calories per day and incorporate exercise into daily routine.  - phentermine (ADIPEX-P) 37.5 MG tablet; Take 1 tablet (37.5 mg total) by mouth daily before breakfast.  Dispense: 30 tablet; Refill: 1  General Counseling: Yeraldine verbalizes understanding of the findings of todays visit and agrees with plan of treatment. I have discussed any further diagnostic evaluation that may be needed or ordered today. We also reviewed her medications today. she has been encouraged to call the office with any questions or concerns that should arise related to todays visit.    There is a liability release in patients' chart. There has been a 10 minute discussion about the side effects including but not limited to elevated blood pressure, anxiety, lack of sleep and dry mouth. Pt understands and will like to start/continue on appetite suppressant at this time. There will be one month RX given at the time of visit with proper follow up. Nova diet plan with restricted calories is given to the pt. Pt understands and agrees with  plan of treatment  This patient was seen by Vincent Gros FNP Collaboration with Dr Lyndon Code as a part of collaborative care agreement  Meds ordered this encounter  Medications  . phentermine (ADIPEX-P) 37.5 MG tablet    Sig: Take 1 tablet (37.5 mg total) by mouth daily before breakfast.     Dispense:  30 tablet    Refill:  1    Order Specific Question:   Supervising Provider    Answer:   Lyndon Code [[id number]]    Total time spent:25Minutes Time spent includes review of chart, medications, test results, and follow up plan with the patient.      Dr Lyndon Code Internal medicine

## 2020-05-03 ENCOUNTER — Telehealth: Payer: Self-pay

## 2020-05-03 NOTE — Telephone Encounter (Signed)
Confirmed and screened for 05-05-20 ov. 

## 2020-05-05 ENCOUNTER — Ambulatory Visit: Payer: Managed Care, Other (non HMO) | Admitting: Nurse Practitioner

## 2020-05-06 ENCOUNTER — Other Ambulatory Visit: Payer: Self-pay | Admitting: Nurse Practitioner

## 2020-05-06 DIAGNOSIS — Z6834 Body mass index (BMI) 34.0-34.9, adult: Secondary | ICD-10-CM

## 2020-05-06 MED ORDER — PHENTERMINE HCL 37.5 MG PO TABS: 37.5000 mg | ORAL_TABLET | Freq: Every day | ORAL | 1 refills | Status: DC

## 2020-06-14 ENCOUNTER — Telehealth: Payer: Self-pay

## 2020-06-14 NOTE — Telephone Encounter (Signed)
Confirmed and screened for OV on 9/10 °

## 2020-06-17 ENCOUNTER — Other Ambulatory Visit: Payer: Self-pay

## 2020-06-17 ENCOUNTER — Ambulatory Visit (INDEPENDENT_AMBULATORY_CARE_PROVIDER_SITE_OTHER): Payer: Managed Care, Other (non HMO) | Admitting: Nurse Practitioner

## 2020-06-17 ENCOUNTER — Encounter: Payer: Self-pay | Admitting: Nurse Practitioner

## 2020-06-17 VITALS — BP 128/96 | HR 83 | Temp 97.5°F | Resp 16 | Ht 72.0 in | Wt 243.2 lb

## 2020-06-17 DIAGNOSIS — Z0001 Encounter for general adult medical examination with abnormal findings: Secondary | ICD-10-CM

## 2020-06-17 DIAGNOSIS — R5383 Other fatigue: Secondary | ICD-10-CM

## 2020-06-17 DIAGNOSIS — R300 Dysuria: Secondary | ICD-10-CM

## 2020-06-17 DIAGNOSIS — Z6834 Body mass index (BMI) 34.0-34.9, adult: Secondary | ICD-10-CM

## 2020-06-17 MED ORDER — PHENTERMINE HCL 37.5 MG PO TABS: 37.5000 mg | ORAL_TABLET | Freq: Every day | ORAL | 1 refills | Status: DC

## 2020-06-17 NOTE — Progress Notes (Signed)
[facility] [address] [place], [place] [address]  Internal MEDICINE  Office Visit Note  Patient Name: Kaylee Rodgers  [id number]  [id number]  Date of Service: 07/03/2020   Pt is here for routine health maintenance examination  Chief Complaint  Patient presents with  . Annual Exam  . Quality Metric Gaps    flu,tetnaus, Hep C     The patient is here for health maintenance exam. She needs to have biometric screening form completed and returned for her work. She has fallen short of the BMI requirement set by her employer. She needs to be part of medically managed program for weight management. She has taken phentermine off and on since May, 2021. She is exercising more frequently, and she is keeping her calorie intake to 1200 calories per day. She has lost 11 pounds since her last visit with me. She would like to try again with this program. She does need to have new set of routine, fasting labs drawn.       Current Medication: Outpatient Encounter Medications as of 06/17/2020  Medication Sig  . cetirizine (ZYRTEC) 10 MG tablet Take 10 mg by mouth daily.  . Cholecalciferol (VITAMIN D3) 1000 units CAPS Take by mouth.  . fexofenadine (ALLEGRA) 180 MG tablet Take 180 mg by mouth daily.  . fluconazole (DIFLUCAN) 150 MG tablet Take 1 tablet (150 mg total) by mouth every three (3) days as needed.  Marland Kitchen ibuprofen (ADVIL) 200 MG tablet Take by mouth.  . meloxicam (MOBIC) 15 MG tablet Take 1 tablet (15 mg total) by mouth daily. Take with food.  . metaxalone (SKELAXIN) 800 MG tablet Take 1 tablet (800 mg total) by mouth 3 (three) times daily.  . phentermine (ADIPEX-P) 37.5 MG tablet Take 1 tablet (37.5 mg total) by mouth daily before breakfast.  . [DISCONTINUED] phentermine (ADIPEX-P) 37.5 MG tablet Take 1 tablet (37.5 mg total) by mouth daily before breakfast.   No facility-administered encounter medications on file as of 06/17/2020.    Surgical History: Past Surgical  History:  Procedure Laterality Date  . TONSILLECTOMY      Medical History: Past Medical History:  Diagnosis Date  . Allergy     Family History: Family History  Problem Relation Age of Onset  . Arthritis/Rheumatoid Mother   . Diabetes Mother   . Hypertension Father       Review of Systems  Constitutional: Negative for chills, fatigue and unexpected weight change.       11 pound weight loss since her last visit.   HENT: Negative for congestion, postnasal drip, rhinorrhea, sneezing and sore throat.   Respiratory: Negative for cough, chest tightness, shortness of breath and wheezing.   Cardiovascular: Negative for chest pain and palpitations.  Gastrointestinal: Negative for abdominal pain, constipation, diarrhea, nausea and vomiting.  Endocrine: Negative for cold intolerance, heat intolerance, polydipsia and polyuria.  Genitourinary: Negative for dysuria, frequency and urgency.  Musculoskeletal: Negative for arthralgias, back pain, joint swelling and neck pain.  Skin: Negative for rash.  Allergic/Immunologic: Negative for environmental allergies.  Neurological: Negative for dizziness, tremors, numbness and headaches.  Hematological: Negative for adenopathy. Does not bruise/bleed easily.  Psychiatric/Behavioral: Negative for behavioral problems (Depression), sleep disturbance and suicidal ideas. The patient is not nervous/anxious.     Today's Vitals   06/17/20 1400  BP: (!) 128/96  Pulse: 83  Resp: 16  Temp: (!) 97.5 F (36.4 C)  SpO2: 99%  Weight: 243 lb 3.2 oz (110.3 kg)  Height: 6' (  1.829 m)   Body mass index is 32.98 kg/m.   Physical Exam Vitals and nursing note reviewed.  Constitutional:      General: She is not in acute distress.    Appearance: Normal appearance. She is well-developed. She is not diaphoretic.  HENT:     Head: Normocephalic and atraumatic.     Nose: Nose normal.     Mouth/Throat:     Pharynx: No oropharyngeal exudate.  Eyes:     Pupils:  Pupils are equal, round, and reactive to light.  Neck:     Thyroid: No thyromegaly.     Vascular: No JVD.     Trachea: No tracheal deviation.  Cardiovascular:     Rate and Rhythm: Normal rate and regular rhythm.     Pulses: Normal pulses.     Heart sounds: Normal heart sounds. No murmur heard.  No friction rub. No gallop.   Pulmonary:     Effort: Pulmonary effort is normal. No respiratory distress.     Breath sounds: Normal breath sounds. No wheezing or rales.  Chest:     Chest wall: No tenderness.     Breasts:        Right: Normal. No swelling, bleeding, inverted nipple, mass, nipple discharge, skin change or tenderness.        Left: Normal. No swelling, bleeding, inverted nipple, mass, nipple discharge, skin change or tenderness.  Abdominal:     General: Bowel sounds are normal.     Palpations: Abdomen is soft.     Tenderness: There is no abdominal tenderness.  Musculoskeletal:        General: Normal range of motion.     Cervical back: Normal range of motion and neck supple.  Lymphadenopathy:     Cervical: No cervical adenopathy.     Upper Body:     Right upper body: No axillary adenopathy.     Left upper body: No axillary adenopathy.  Skin:    General: Skin is warm and dry.  Neurological:     General: No focal deficit present.     Mental Status: She is alert and oriented to person, place, and time.     Cranial Nerves: No cranial nerve deficit.  Psychiatric:        Mood and Affect: Mood normal.        Behavior: Behavior normal.        Thought Content: Thought content normal.        Judgment: Judgment normal.      LABS: Recent Results (from the past 2160 hour(s))  UA/M w/rflx Culture, Routine     Status: None   Collection Time: 06/17/20  3:28 PM   Specimen: Urine   Urine  Result Value Ref Range   Specific Gravity, UA 1.006 1.005 - 1.030   pH, UA 6.5 5.0 - 7.5   Color, UA Yellow Yellow   Appearance Ur Clear Clear   Leukocytes,UA Negative Negative   Protein,UA  Negative Negative/Trace   Glucose, UA Negative Negative   Ketones, UA Negative Negative   RBC, UA Negative Negative   Bilirubin, UA Negative Negative   Urobilinogen, Ur 0.2 0.2 - 1.0 mg/dL   Nitrite, UA Negative Negative   Microscopic Examination Comment     Comment: Microscopic follows if indicated.   Microscopic Examination See below:     Comment: Microscopic was indicated and was performed.   Urinalysis Reflex Comment     Comment: This specimen will not reflex to a Urine Culture.  Microscopic Examination     Status: None   Collection Time: 06/17/20  3:28 PM   Urine  Result Value Ref Range   WBC, UA None seen 0 - 5 /hpf   RBC None seen 0 - 2 /hpf   Epithelial Cells (non renal) None seen 0 - 10 /hpf   Casts None seen None seen /lpf   Bacteria, UA None seen None seen/Few    Assessment/Plan: 1. Encounter for general adult medical examination with abnormal findings Annual health maintenance exam today. Order slip given to have routine, fasting labs done. Biometric screening form completed and returned to the patient during her visit.   2. BMI 34.0-34.9,adult Eleven pound weight loss since her last visit. Will try another round phentermine daily. Continue to limit calorie intake to 1200 calories per day and incorporate exercise into daily routine.  - phentermine (ADIPEX-P) 37.5 MG tablet; Take 1 tablet (37.5 mg total) by mouth daily before breakfast.  Dispense: 30 tablet; Refill: 1  3. Other fatigue Check labs for further evaluation.   4. Dysuria - UA/M w/rflx Culture, Routine  General Counseling: Bryttney verbalizes understanding of the findings of todays visit and agrees with plan of treatment. I have discussed any further diagnostic evaluation that may be needed or ordered today. We also reviewed her medications today. she has been encouraged to call the office with any questions or concerns that should arise related to todays visit.    Counseling:  Obesity  Counseling: Risk Assessment: An assessment of behavioral risk factors was made today and includes lack of exercise sedentary lifestyle, lack of portion control and poor dietary habits.  Risk Modification Advice: She was counseled on portion control guidelines. Restricting daily caloric intake to. . The detrimental long term effects of obesity on her health and ongoing poor compliance was also discussed with the patient.  This patient was seen by Vincent Gros FNP Collaboration with Dr Lyndon Code as a part of collaborative care agreement   Orders Placed This Encounter  Procedures  . Microscopic Examination  . UA/M w/rflx Culture, Routine    Meds ordered this encounter  Medications  . phentermine (ADIPEX-P) 37.5 MG tablet    Sig: Take 1 tablet (37.5 mg total) by mouth daily before breakfast.    Dispense:  30 tablet    Refill:  1    Order Specific Question:   Supervising Provider    Answer:   Lyndon Code [[id number]]    Total time spent: 45 Minutes  Time spent includes review of chart, medications, test results, and follow up plan with the patient.     Lyndon Code, MD  Internal Medicine

## 2020-06-18 LAB — MICROSCOPIC EXAMINATION
Bacteria, UA: NONE SEEN
Casts: NONE SEEN /lpf
Epithelial Cells (non renal): NONE SEEN /hpf (ref 0–10)
RBC, Urine: NONE SEEN /hpf (ref 0–2)
WBC, UA: NONE SEEN /hpf (ref 0–5)

## 2020-06-18 LAB — UA/M W/RFLX CULTURE, ROUTINE
Bilirubin, UA: NEGATIVE
Glucose, UA: NEGATIVE
Ketones, UA: NEGATIVE
Leukocytes,UA: NEGATIVE
Nitrite, UA: NEGATIVE
Protein,UA: NEGATIVE
RBC, UA: NEGATIVE
Specific Gravity, UA: 1.006 (ref 1.005–1.030)
Urobilinogen, Ur: 0.2 mg/dL (ref 0.2–1.0)
pH, UA: 6.5 (ref 5.0–7.5)

## 2020-07-03 DIAGNOSIS — Z0001 Encounter for general adult medical examination with abnormal findings: Secondary | ICD-10-CM | POA: Insufficient documentation

## 2020-07-20 ENCOUNTER — Telehealth: Payer: Self-pay

## 2020-07-20 NOTE — Telephone Encounter (Signed)
Pt was approved for PA for medication Phentermine 37.5 mg through 01/17/2021.  dbs

## 2020-08-18 ENCOUNTER — Encounter: Payer: Self-pay | Admitting: Nurse Practitioner

## 2020-08-18 ENCOUNTER — Other Ambulatory Visit: Payer: Self-pay

## 2020-08-18 ENCOUNTER — Ambulatory Visit: Payer: Managed Care, Other (non HMO) | Admitting: Nurse Practitioner

## 2020-08-18 VITALS — BP 141/93 | HR 88 | Temp 97.4°F | Resp 16 | Ht 72.0 in | Wt 248.0 lb

## 2020-08-18 DIAGNOSIS — G43009 Migraine without aura, not intractable, without status migrainosus: Secondary | ICD-10-CM | POA: Diagnosis not present

## 2020-08-18 DIAGNOSIS — Z6833 Body mass index (BMI) 33.0-33.9, adult: Secondary | ICD-10-CM

## 2020-08-18 MED ORDER — PHENDIMETRAZINE TARTRATE ER 105 MG PO CP24: 1.0000 | ORAL_CAPSULE | Freq: Every day | ORAL | 0 refills | Status: DC

## 2020-08-18 NOTE — Progress Notes (Addendum)
[facility] [address] [place], [place] [address]  Internal MEDICINE  Office Visit Note  Patient Name: Kaylee Rodgers  [id number]  [id number]  Date of Service: 09/11/2020  Chief Complaint  Patient presents with  . Follow-up  . Quality Metric Gaps    flu  . controlled substance form    reviewed with PT    The patient is here for follow up visit. She is currently taking phentermine to help with weight management. She has been taking this off and on since 12/2019. She started at weight of 271. She has lost nearly 30 pounds since then. This is first month when she had a weight gain. She had a five pound weight gain since last visit. She states that it [birth year] her birthday. States that she has been fatigued since the weather has gotten colder and it is getting darker at earlier times.  She is having migraine headaches during her menstrual cycle. Able take Excedrin migraine for this. Does help, but can only take this once in 24 hours.       Current Medication: Outpatient Encounter Medications as of 08/18/2020  Medication Sig Note  . cetirizine (ZYRTEC) 10 MG tablet Take 10 mg by mouth daily.   . Cholecalciferol (VITAMIN D3) 1000 units CAPS Take by mouth.   . fexofenadine (ALLEGRA) 180 MG tablet Take 180 mg by mouth daily.   Marland Kitchen ibuprofen (ADVIL) 200 MG tablet Take by mouth.   . meloxicam (MOBIC) 15 MG tablet Take 1 tablet (15 mg total) by mouth daily. Take with food.   . metaxalone (SKELAXIN) 800 MG tablet Take 1 tablet (800 mg total) by mouth 3 (three) times daily.   . [DISCONTINUED] fluconazole (DIFLUCAN) 150 MG tablet Take 1 tablet (150 mg total) by mouth every three (3) days as needed.   . [DISCONTINUED] phentermine (ADIPEX-P) 37.5 MG tablet Take 1 tablet (37.5 mg total) by mouth daily before breakfast. 08/18/2020: trial of bontril  . Phendimetrazine Tartrate 105 MG CP24 Take 1 capsule (105 mg total) by mouth daily.    No facility-administered encounter medications on  file as of 08/18/2020.    Surgical History: Past Surgical History:  Procedure Laterality Date  . TONSILLECTOMY      Medical History: Past Medical History:  Diagnosis Date  . Allergy     Family History: Family History  Problem Relation Age of Onset  . Arthritis/Rheumatoid Mother   . Diabetes Mother   . Hypertension Father     Social History   Socioeconomic History  . Marital status: Single    Spouse name: Not on file  . Number of children: Not on file  . Years of education: Not on file  . Highest education level: Not on file  Occupational History  . Not on file  Tobacco Use  . Smoking status: Former Games developer  . Smokeless tobacco: Never Used  Vaping Use  . Vaping Use: Never used  Substance and Sexual Activity  . Alcohol use: Yes    Comment: socially  . Drug use: Never  . Sexual activity: Not on file  Other Topics Concern  . Not on file  Social History Narrative  . Not on file   Social Determinants of Health   Financial Resource Strain:   . Difficulty of Paying Living Expenses: Not on file  Food Insecurity:   . Worried About Programme researcher, broadcasting/film/video in the Last Year: Not on file  . Ran Out of Food in the Last Year: Not  on file  Transportation Needs:   . Lack of Transportation (Medical): Not on file  . Lack of Transportation (Non-Medical): Not on file  Physical Activity:   . Days of Exercise per Week: Not on file  . Minutes of Exercise per Session: Not on file  Stress:   . Feeling of Stress : Not on file  Social Connections:   . Frequency of Communication with Friends and Family: Not on file  . Frequency of Social Gatherings with Friends and Family: Not on file  . Attends Religious Services: Not on file  . Active Member of Clubs or Organizations: Not on file  . Attends Banker Meetings: Not on file  . Marital Status: Not on file  Intimate Partner Violence:   . Fear of Current or Ex-Partner: Not on file  . Emotionally Abused: Not on file  .  Physically Abused: Not on file  . Sexually Abused: Not on file      Review of Systems  Constitutional: Negative for chills, fatigue and unexpected weight change.       Five pound weight gain  HENT: Negative for congestion, postnasal drip, rhinorrhea, sneezing and sore throat.   Respiratory: Negative for cough, chest tightness and shortness of breath.   Cardiovascular: Negative for chest pain and palpitations.  Gastrointestinal: Negative for abdominal pain, constipation, diarrhea, nausea and vomiting.  Endocrine: Negative for cold intolerance, heat intolerance, polydipsia and polyuria.  Musculoskeletal: Negative for arthralgias, back pain, joint swelling and neck pain.  Skin: Negative for rash.  Allergic/Immunologic: Negative for environmental allergies.  Neurological: Positive for headaches. Negative for tremors and numbness.  Hematological: Negative for adenopathy. Does not bruise/bleed easily.  Psychiatric/Behavioral: Negative for behavioral problems (Depression), sleep disturbance and suicidal ideas. The patient is not nervous/anxious.     Today's Vitals   08/18/20 1435  BP: (!) 141/93  Pulse: 88  Resp: 16  Temp: (!) 97.4 F (36.3 C)  SpO2: 98%  Weight: 248 lb (112.5 kg)  Height: 6' (1.829 m)   Body mass index is 33.63 kg/m.  Physical Exam Vitals and nursing note reviewed.  Constitutional:      General: She is not in acute distress.    Appearance: Normal appearance. She is well-developed. She is obese. She is not diaphoretic.  HENT:     Head: Normocephalic and atraumatic.     Nose: Nose normal.     Mouth/Throat:     Pharynx: No oropharyngeal exudate.  Eyes:     Pupils: Pupils are equal, round, and reactive to light.  Neck:     Thyroid: No thyromegaly.     Vascular: No JVD.     Trachea: No tracheal deviation.  Cardiovascular:     Rate and Rhythm: Normal rate and regular rhythm.     Heart sounds: Normal heart sounds. No murmur heard.  No friction rub. No  gallop.   Pulmonary:     Effort: Pulmonary effort is normal. No respiratory distress.     Breath sounds: Normal breath sounds. No wheezing or rales.  Chest:     Chest wall: No tenderness.  Abdominal:     Palpations: Abdomen is soft.  Musculoskeletal:        General: Normal range of motion.     Cervical back: Normal range of motion and neck supple.  Lymphadenopathy:     Cervical: No cervical adenopathy.  Skin:    General: Skin is warm and dry.  Neurological:     Mental Status: She is alert  and oriented to person, place, and time.     Cranial Nerves: No cranial nerve deficit.  Psychiatric:        Behavior: Behavior normal.        Thought Content: Thought content normal.        Judgment: Judgment normal.     Assessment/Plan: 1. Migraine without aura and without status migrainosus, not intractable mensrual migraines. Continue to take OTC abrtive therapy as needed and as indicated. Will monitor closely.  She needs to have close BP monitoring, will reassess on next visit ( added by Dr Beverely Risen)  2. BMI 33.0-33.9,adult Trial of Bontril CR daily. Continue to consume 1200 calorie diet and incorporate exercise into daily routine. Reassess in thirty days.  - Phendimetrazine Tartrate 105 MG CP24; Take 1 capsule (105 mg total) by mouth daily.  Dispense: 30 capsule; Refill: 0  General Counseling: Donta verbalizes understanding of the findings of todays visit and agrees with plan of treatment. I have discussed any further diagnostic evaluation that may be needed or ordered today. We also reviewed her medications today. she has been encouraged to call the office with any questions or concerns that should arise related to todays visit.    There is a liability release in patients' chart. There has been a 10 minute discussion about the side effects including but not limited to elevated blood pressure, anxiety, lack of sleep and dry mouth. Pt understands and will like to start/continue on  appetite suppressant at this time. There will be one month RX given at the time of visit with proper follow up. Nova diet plan with restricted calories is given to the pt. Pt understands and agrees with  plan of treatment  This patient was seen by Vincent Gros FNP Collaboration with Dr Lyndon Code as a part of collaborative care agreement  Meds ordered this encounter  Medications  . Phendimetrazine Tartrate 105 MG CP24    Sig: Take 1 capsule (105 mg total) by mouth daily.    Dispense:  30 capsule    Refill:  0    D/c phentermine    Order Specific Question:   Supervising Provider    Answer:   Lyndon Code [[id number]]    Total time spent: 25 Minutes   Time spent includes review of chart, medications, test results, and follow up plan with the patient.      Dr Lyndon Code Internal medicine

## 2020-08-29 ENCOUNTER — Telehealth: Payer: Self-pay

## 2020-08-29 NOTE — Telephone Encounter (Signed)
This most recent prescription was for the phendimetrizine 105mg  . She feels like this one worked better for her.

## 2020-09-11 DIAGNOSIS — G43009 Migraine without aura, not intractable, without status migrainosus: Secondary | ICD-10-CM | POA: Insufficient documentation

## 2020-09-19 ENCOUNTER — Ambulatory Visit: Payer: Managed Care, Other (non HMO) | Admitting: Nurse Practitioner

## 2020-11-04 ENCOUNTER — Other Ambulatory Visit: Payer: Self-pay | Admitting: Hospice and Palliative Medicine

## 2020-11-12 ENCOUNTER — Encounter: Payer: Self-pay | Admitting: Emergency Medicine

## 2020-11-12 ENCOUNTER — Other Ambulatory Visit: Payer: Self-pay

## 2020-11-12 ENCOUNTER — Ambulatory Visit
Admission: EM | Admit: 2020-11-12 | Discharge: 2020-11-12 | Disposition: A | Discharge status: Home / Self Care | Payer: Managed Care, Other (non HMO) | Attending: Sports Medicine | Admitting: Sports Medicine

## 2020-11-12 DIAGNOSIS — H9201 Otalgia, right ear: Secondary | ICD-10-CM | POA: Diagnosis not present

## 2020-11-12 NOTE — ED Triage Notes (Signed)
Patient in today c/o right ear pain x 2 days and drainage from her ear x 2 weeks. Patient states she cleaned her ear 3-4 days ago with a Q-tip and pain started after that. Patient denies fever. Patient has not taken any OTC medications.

## 2020-11-12 NOTE — Discharge Instructions (Addendum)
We discussed the use of antibiotics which I not feel are necessary. Certainly if you are worsening in any way you should come back and see [place] or see your primary care physician for reevaluation.  For now though you do not have an infection. Over-the-counter meds as needed, Tylenol or Motrin for fever or discomfort.

## 2020-11-12 NOTE — ED Provider Notes (Signed)
[facility]    CSN: [id number] Arrival date & time: 11/12/20  4098      History   Chief Complaint Chief Complaint  Patient presents with  . Otalgia    right    HPI Kaylee Rodgers is a 35 y.o. female.   Pleasant 35 year old female who presents for evaluation of the above issues.  She said she has had some right ear discomfort for about 2 days now.  She is also noted a little bit of right-sided sinus pressure for about 2 weeks.  There is been a little bit of associated postnasal drip.  No cough chest pain or shortness of breath.  No fever shakes chills.  No abdominal pain or urinary symptoms.  No Covid exposure.  She has been vaccinated and has had the booster.  She is also had her flu shot.  She works from home.  No red flag signs or symptoms elicited on history.     Past Medical History:  Diagnosis Date  . Allergy     Patient Active Problem List   Diagnosis Date Noted  . Migraine without aura and without status migrainosus, not intractable 09/11/2020  . Encounter for general adult medical examination with abnormal findings 07/03/2020  . Other fatigue 06/16/2019  . Irritant contact dermatitis due to plants, except food 04/26/2019  . Urinary tract infection without hematuria 02/11/2019  . Dysuria 02/11/2019  . Routine cervical smear 06/18/2018  . Flu vaccine need 06/18/2018  . BMI 33.0-33.9,adult 06/18/2018  . Neck pain, musculoskeletal 03/23/2018    Past Surgical History:  Procedure Laterality Date  . TONSILLECTOMY      OB History   No obstetric history on file.      Home Medications    Prior to Admission medications   Medication Sig Start Date End Date Taking? Authorizing Provider  fexofenadine (ALLEGRA) 180 MG tablet Take 180 mg by mouth daily.   Yes [provider]  ibuprofen (ADVIL) 200 MG tablet Take by mouth.   Yes [provider]  meloxicam (MOBIC) 15 MG tablet Take 1 tablet (15 mg total) by mouth daily. Take with  food. 12/27/19  Yes Lutricia Feil, PA-C  metaxalone (SKELAXIN) 800 MG tablet Take 1 tablet (800 mg total) by mouth 3 (three) times daily. 12/27/19  Yes Lutricia Feil, PA-C  Multiple Vitamins-Minerals (EMERGEN-C IMMUNE PLUS/VIT D) CHEW Chew 1 tablet by mouth daily.   Yes [provider]  cetirizine (ZYRTEC) 10 MG tablet Take 10 mg by mouth daily.    [provider]  Cholecalciferol (VITAMIN D3) 1000 units CAPS Take by mouth.    [provider]  Phendimetrazine Tartrate 105 MG CP24 Take 1 capsule (105 mg total) by mouth daily. 08/18/20   Carlean Jews, NP    Family History Family History  Problem Relation Age of Onset  . Arthritis/Rheumatoid Mother   . Diabetes Mother   . Hypertension Father     Social History Social History   Tobacco Use  . Smoking status: Former Smoker    Quit date: 11/12/2005    Years since quitting: 15.0  . Smokeless tobacco: Never Used  Vaping Use  . Vaping Use: Never used  Substance Use Topics  . Alcohol use: Yes    Comment: socially  . Drug use: Never     Allergies   Patient has no known allergies.   Review of Systems Review of Systems  Constitutional: Negative for chills, fatigue and fever.  HENT: Positive for  ear pain and sinus pressure. Negative for congestion, ear discharge, rhinorrhea, sinus pain, sneezing and sore throat.   Eyes: Negative for pain.  Respiratory: Negative for cough, chest tightness, shortness of breath and wheezing.   Cardiovascular: Negative for chest pain and palpitations.  Gastrointestinal: Negative for abdominal pain.  Genitourinary: Negative for dysuria.  Musculoskeletal: Negative for myalgias.  Skin: Negative for color change, pallor, rash and wound.  Neurological: Negative for dizziness, syncope, light-headedness, numbness and headaches.     Physical Exam Triage Vital Signs ED Triage Vitals  Enc Vitals Group     BP 11/12/20 0846 129/89     Pulse Rate 11/12/20 0846 82      Resp 11/12/20 0846 18     Temp 11/12/20 0846 98.2 F (36.8 C)     Temp Source 11/12/20 0846 Oral     SpO2 11/12/20 0846 100 %     Weight 11/12/20 0847 260 lb (117.9 kg)     Height 11/12/20 0847 6' (1.829 m)     Head Circumference --      Peak Flow --      Pain Score 11/12/20 0846 3     Pain Loc --      Pain Edu? --      Excl. in GC? --    No data found.  Updated Vital Signs BP 129/89 (BP Location: Left Arm)   Pulse 82   Temp 98.2 F (36.8 C) (Oral)   Resp 18   Ht 6' (1.829 m)   Wt 117.9 kg   LMP 11/05/2020 (Approximate)   SpO2 100%   BMI 35.26 kg/m   Visual Acuity Right Eye Distance:   Left Eye Distance:   Bilateral Distance:    Right Eye Near:   Left Eye Near:    Bilateral Near:     Physical Exam Vitals and nursing note reviewed.  Constitutional:      General: She is not in acute distress.    Appearance: Normal appearance. She is not ill-appearing or toxic-appearing.  HENT:     Head: Normocephalic and atraumatic.     Left Ear: Tympanic membrane normal.     Ears:     Comments: Right TM is normal.  There is some irritation of the canal from use of a Q-tip.  No infection noted.    Nose: Nose normal. No congestion or rhinorrhea.     Mouth/Throat:     Mouth: Mucous membranes are dry.     Pharynx: No oropharyngeal exudate or posterior oropharyngeal erythema.  Eyes:     Extraocular Movements: Extraocular movements intact.     Conjunctiva/sclera: Conjunctivae normal.     Pupils: Pupils are equal, round, and reactive to light.  Cardiovascular:     Rate and Rhythm: Normal rate and regular rhythm.     Pulses: Normal pulses.     Heart sounds: Normal heart sounds. No murmur heard. No friction rub. No gallop.   Pulmonary:     Effort: Pulmonary effort is normal. No respiratory distress.     Breath sounds: Normal breath sounds. No stridor. No wheezing, rhonchi or rales.  Musculoskeletal:        General: Normal range of motion.     Cervical back: Normal range of  motion and neck supple. No rigidity or tenderness.  Lymphadenopathy:     Cervical: Cervical adenopathy present.  Skin:    General: Skin is warm and dry.     Capillary Refill: Capillary refill takes less than 2 seconds.  Neurological:     General: No focal deficit present.     Mental Status: She is alert and oriented to person, place, and time.      UC Treatments / Results  Labs (all labs ordered are listed, but only abnormal results are displayed) Labs Reviewed - No data to display  EKG   Radiology No results found.  Procedures Procedures (including critical care time)  Medications Ordered in UC Medications - No data to display  Initial Impression / Assessment and Plan / UC Course  I have reviewed the triage vital signs and the nursing notes.  Pertinent labs & imaging results that were available during my care of the patient were reviewed by me and considered in my medical decision making (see chart for details).   Clinical impression: 2 days of right ear pain with some irritation of the canal.  There is no active infection either in the middle or external ear.  Treatment plan: 1.  The findings and treatment plan were discussed in detail with the patient.  Patient was in agreement. 2.  We discussed the use of antibiotics which I did not feel was necessary.  Patient was in agreement. 3.  We will give her an educational handout and she can use over-the-counter remedies from the drugstore. 4.  Certainly if she was worsening in any way she should come back and see [place] or see her primary care physician for reevaluation.  For now though she does not have an infection. 5.  She works from home and does not need a note. 6.  Over-the-counter meds as needed, Tylenol or Motrin for fever or discomfort. 7.  Follow-up here as needed.    Final Clinical Impressions(s) / UC Diagnoses   Final diagnoses:  Otalgia of right ear     Discharge Instructions     We discussed the use of  antibiotics which I not feel are necessary. Certainly if you are worsening in any way you should come back and see [place] or see your primary care physician for reevaluation.  For now though you do not have an infection. Over-the-counter meds as needed, Tylenol or Motrin for fever or discomfort.    ED Prescriptions    None     PDMP not reviewed this encounter.   Delton See, MD 11/12/20 [phone]

## 2020-12-08 ENCOUNTER — Encounter: Payer: Self-pay | Admitting: Nurse Practitioner

## 2021-01-17 ENCOUNTER — Encounter: Payer: Self-pay | Admitting: Nurse Practitioner

## 2021-01-17 ENCOUNTER — Ambulatory Visit: Payer: Managed Care, Other (non HMO) | Admitting: Nurse Practitioner

## 2021-01-17 ENCOUNTER — Other Ambulatory Visit: Payer: Self-pay

## 2021-01-17 VITALS — BP 115/78 | HR 66 | Temp 98.8°F | Ht 73.0 in | Wt 276.0 lb

## 2021-01-17 DIAGNOSIS — R5383 Other fatigue: Secondary | ICD-10-CM

## 2021-01-17 DIAGNOSIS — E559 Vitamin D deficiency, unspecified: Secondary | ICD-10-CM

## 2021-01-17 DIAGNOSIS — R635 Abnormal weight gain: Secondary | ICD-10-CM

## 2021-01-17 DIAGNOSIS — Z6836 Body mass index (BMI) 36.0-36.9, adult: Secondary | ICD-10-CM

## 2021-01-17 DIAGNOSIS — Z7689 Persons encountering health services in other specified circumstances: Secondary | ICD-10-CM | POA: Diagnosis not present

## 2021-01-17 DIAGNOSIS — Z0000 Encounter for general adult medical examination without abnormal findings: Secondary | ICD-10-CM

## 2021-01-17 MED ORDER — PHENTERMINE HCL 37.5 MG PO TABS: 37.5000 mg | ORAL_TABLET | Freq: Every day | ORAL | 1 refills | Status: DC

## 2021-01-17 NOTE — Progress Notes (Signed)
New Patient Office Visit  Subjective:  Patient ID: Kaylee Rodgers, female    DOB: [birth year]  Age: 35 y.o. MRN: [id number]  CC:  Chief Complaint  Patient presents with  . New Patient (Initial Visit)    HPI Kaylee Rodgers presents to establish new primary care office. She is choosing to change offices and stay with current PCP to maintain continuity of care. Since her most recent visit with me, she has had recurrence of neck pain. 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 She had physical therapy for this for about 6 months. Has done much better since then without further pain or problems.  She is concerned about weight gain. Has gained about 14 pounds since she was last seen. She has already taken some stps to help reverse this. She has oined weight watchers. She did log her food for about 2 weeks. Was able to lose eight pounds. She states that keeping up with demand of constant logging was difficult for her to maintain. She would like to restart phentermine. She has been on this in the past. Has been nearly a year since she last took this medication. She was able to lose more than 30 pounds when combining the medication with a reduced calorie diet and increased physical activity. She denies negative side effects from taking phentermine.  She is due to have routine, fasting labs.  She denies chest pain, chest pressure, shortness of breath, or headaches. She denies headaches. She denies nausea, vomiting, or changes in her bowel or bladder habits.   Past Medical History:  Diagnosis Date  . Allergy     Past Surgical History:  Procedure Laterality Date  . TONSILLECTOMY      Family History  Problem Relation Age of Onset  . Arthritis/Rheumatoid Mother   . Diabetes Mother   . Hypertension Father   . Alcoholism Father   . High blood pressure Father   . Diabetes Maternal Grandmother   . Cancer Paternal Grandmother   . High Cholesterol  Paternal Grandmother   . High blood pressure Paternal Grandfather     Social History   Socioeconomic History  . Marital status: Married    Spouse name: Not on file  . Number of children: Not on file  . Years of education: Not on file  . Highest education level: Not on file  Occupational History  . Not on file  Tobacco Use  . Smoking status: Former Smoker    Quit date: 11/12/2005    Years since quitting: 15.2  . Smokeless tobacco: Never Used  Vaping Use  . Vaping Use: Never used  Substance and Sexual Activity  . Alcohol use: Yes    Comment: socially  . Drug use: Never  . Sexual activity: Yes    Partners: Female  Other Topics Concern  . Not on file  Social History Narrative  . Not on file   Social Determinants of Health   Financial Resource Strain: Not on file  Food Insecurity: Not on file  Transportation Needs: Not on file  Physical Activity: Not on file  Stress: Not on file  Social Connections: Not on file  Intimate Partner Violence: Not on file    ROS Review of Systems  Constitutional: Negative for chills and fever.       Weight gain of 14 pounds since her last visit.   HENT: Negative for congestion, postnasal drip, rhinorrhea, sinus pressure and sinus pain.   Eyes: Negative.  Respiratory: Negative for apnea, cough and wheezing.   Cardiovascular: Negative for chest pain and palpitations.  Gastrointestinal: Negative for constipation, diarrhea, nausea and vomiting.  Endocrine: Negative.   Musculoskeletal: Negative for back pain and myalgias.  Skin: Negative for rash.  Allergic/Immunologic: Negative for environmental allergies.  Neurological: Negative for dizziness, weakness and headaches.  Hematological: Negative for adenopathy.  Psychiatric/Behavioral: Negative for dysphoric mood. The patient is not nervous/anxious.   All other systems reviewed and are negative.   Objective:   Today's Vitals   01/17/21 1549  BP: 115/78  Pulse: 66  Temp: 98.8 F (37.1  C)  SpO2: 100%  Weight: 276 lb (125.2 kg)  Height: 6\' 1"  (1.854 m)   Body mass index is 36.41 kg/m. Physical Exam Vitals and nursing note reviewed.  Constitutional:      Appearance: Normal appearance. She is well-developed.  HENT:     Head: Normocephalic and atraumatic.     Nose: Nose normal.  Eyes:     Extraocular Movements: Extraocular movements intact.     Conjunctiva/sclera: Conjunctivae normal.     Pupils: Pupils are equal, round, and reactive to light.  Cardiovascular:     Rate and Rhythm: Normal rate and regular rhythm.     Pulses: Normal pulses.     Heart sounds: Normal heart sounds.  Pulmonary:     Effort: Pulmonary effort is normal.     Breath sounds: Normal breath sounds.  Abdominal:     Palpations: Abdomen is soft.  Musculoskeletal:        General: Normal range of motion.     Cervical back: Normal range of motion and neck supple.  Skin:    General: Skin is warm and dry.     Capillary Refill: Capillary refill takes less than 2 seconds.  Neurological:     General: No focal deficit present.     Mental Status: She is alert and oriented to person, place, and time.  Psychiatric:        Mood and Affect: Mood normal.        Behavior: Behavior normal.        Thought Content: Thought content normal.        Judgment: Judgment normal.     Assessment & Plan:  1. Encounter to establish care Appointment today to establish new primary care office, keeping her prior PCP to maintain continuity of care.   2. Other fatigue Check basic labs along with thyroid panel for further evaluation.  - CBC with Differential/Platelet - Comprehensive metabolic panel - T4, free - TSH  3. Abnormal weight gain Check thyroid panel for further evaluation.  - T4, free - TSH  4. BMI 36.0-36.9,adult Restart phentermine 37.5mg  tablets daily. Limit calorie intake to 1200-1500 calories per day. Incorporate exercise into her daily routine. Will consider continuing for another 30  days if  successful trial.  - phentermine (ADIPEX-P) 37.5 MG tablet; Take 1 tablet (37.5 mg total) by mouth daily before breakfast.  Dispense: 30 tablet; Refill: 1  5. Vitamin D deficiency Check vitamin d level.  - Vitamin D 1,25 dihydroxy  6. Health care maintenance Check routine, fasting labs.  - CBC with Differential/Platelet - Comprehensive metabolic panel - Lipid panel  Problem List Items Addressed This Visit      Other   BMI 36.0-36.9,adult   Relevant Medications   phentermine (ADIPEX-P) 37.5 MG tablet   Other fatigue   Relevant Orders   CBC with Differential/Platelet (Completed)   Comprehensive metabolic panel (Completed)  T4, free (Completed)   TSH (Completed)   Encounter to establish care - Primary   Abnormal weight gain   Relevant Orders   T4, free (Completed)   TSH (Completed)   Vitamin D deficiency   Relevant Orders   Vitamin D 1,25 dihydroxy (Completed)   Healthcare maintenance   Relevant Orders   CBC with Differential/Platelet (Completed)   Comprehensive metabolic panel (Completed)   Lipid panel (Completed)      Outpatient Encounter Medications as of 01/17/2021  Medication Sig  . cetirizine (ZYRTEC) 10 MG tablet Take 10 mg by mouth daily.  . Cholecalciferol (VITAMIN D3) 1000 units CAPS Take by mouth.  . fexofenadine (ALLEGRA) 180 MG tablet Take 180 mg by mouth daily.  Marland Kitchen ibuprofen (ADVIL) 200 MG tablet Take by mouth.  . meloxicam (MOBIC) 15 MG tablet Take 1 tablet (15 mg total) by mouth daily. Take with food.  . metaxalone (SKELAXIN) 800 MG tablet Take 1 tablet (800 mg total) by mouth 3 (three) times daily.  . Multiple Vitamins-Minerals (EMERGEN-C IMMUNE PLUS/VIT D) CHEW Chew 1 tablet by mouth daily.  . phentermine (ADIPEX-P) 37.5 MG tablet Take 1 tablet (37.5 mg total) by mouth daily before breakfast.  . [DISCONTINUED] Phendimetrazine Tartrate 105 MG CP24 Take 1 capsule (105 mg total) by mouth daily.   No facility-administered encounter medications on file as  of 01/17/2021.   Time spent with the patient was approximately 30 minutes. This time included reviewing progress notes, labs, imaging studies, and discussing plan for follow up.   Follow-up: Return in about 4 weeks (around 02/14/2021) for weight. discuss labs .   Carlean Jews, NP

## 2021-01-17 NOTE — Patient Instructions (Signed)
Calorie Counting for Weight Loss Calories are units of energy. Your body needs a certain number of calories from food to keep going throughout the day. When you eat or drink more calories than your body needs, your body stores the extra calories mostly as fat. When you eat or drink fewer calories than your body needs, your body burns fat to get the energy it needs. Calorie counting means keeping track of how many calories you eat and drink each day. Calorie counting can be helpful if you need to lose weight. If you eat fewer calories than your body needs, you should lose weight. Ask your health care provider what a healthy weight is for you. For calorie counting to work, you will need to eat the right number of calories each day to lose a healthy amount of weight per week. A dietitian can help you figure out how many calories you need in a day and will suggest ways to reach your calorie goal.  A healthy amount of weight to lose each week is usually 1-2 lb (0.5-0.9 kg). This usually means that your daily calorie intake should be reduced by 500-750 calories.  Eating 1,200-1,500 calories a day can help most women lose weight.  Eating 1,500-1,800 calories a day can help most men lose weight. What do I need to know about calorie counting? Work with your health care provider or dietitian to determine how many calories you should get each day. To meet your daily calorie goal, you will need to:  Find out how many calories are in each food that you would like to eat. Try to do this before you eat.  Decide how much of the food you plan to eat.  Keep a food log. Do this by writing down what you ate and how many calories it had. To successfully lose weight, it is important to balance calorie counting with a healthy lifestyle that includes regular activity. Where do I find calorie information? The number of calories in a food can be found on a Nutrition Facts label. If a food does not have a Nutrition Facts  label, try to look up the calories online or ask your dietitian for help. Remember that calories are listed per serving. If you choose to have more than one serving of a food, you will have to multiply the calories per serving by the number of servings you plan to eat. For example, the label on a package of bread might say that a serving size is 1 slice and that there are 90 calories in a serving. If you eat 1 slice, you will have eaten 90 calories. If you eat 2 slices, you will have eaten 180 calories.   How do I keep a food log? After each time that you eat, record the following in your food log as soon as possible:  What you ate. Be sure to include toppings, sauces, and other extras on the food.  How much you ate. This can be measured in cups, ounces, or number of items.  How many calories were in each food and drink.  The total number of calories in the food you ate. Keep your food log near you, such as in a pocket-sized notebook or on an app or website on your mobile phone. Some programs will calculate calories for you and show you how many calories you have left to meet your daily goal. What are some portion-control tips?  Know how many calories are in a serving. This will   help you know how many servings you can have of a certain food.  Use a measuring cup to measure serving sizes. You could also try weighing out portions on a kitchen scale. With time, you will be able to estimate serving sizes for some foods.  Take time to put servings of different foods on your favorite plates or in your favorite bowls and cups so you know what a serving looks like.  Try not to eat straight from a food's packaging, such as from a bag or box. Eating straight from the package makes it hard to see how much you are eating and can lead to overeating. Put the amount you would like to eat in a cup or on a plate to make sure you are eating the right portion.  Use smaller plates, glasses, and bowls for smaller  portions and to prevent overeating.  Try not to multitask. For example, avoid watching TV or using your computer while eating. If it is time to eat, sit down at a table and enjoy your food. This will help you recognize when you are full. It will also help you be more mindful of what and how much you are eating. What are tips for following this plan? Reading food labels  Check the calorie count compared with the serving size. The serving size may be smaller than what you are used to eating.  Check the source of the calories. Try to choose foods that are high in protein, fiber, and vitamins, and low in saturated fat, trans fat, and sodium. Shopping  Read nutrition labels while you shop. This will help you make healthy decisions about which foods to buy.  Pay attention to nutrition labels for low-fat or fat-free foods. These foods sometimes have the same number of calories or more calories than the full-fat versions. They also often have added sugar, starch, or salt to make up for flavor that was removed with the fat.  Make a grocery list of lower-calorie foods and stick to it. Cooking  Try to cook your favorite foods in a healthier way. For example, try baking instead of frying.  Use low-fat dairy products. Meal planning  Use more fruits and vegetables. One-half of your plate should be fruits and vegetables.  Include lean proteins, such as chicken, turkey, and fish. Lifestyle Each week, aim to do one of the following:  150 minutes of moderate exercise, such as walking.  75 minutes of vigorous exercise, such as running. General information  Know how many calories are in the foods you eat most often. This will help you calculate calorie counts faster.  Find a way of tracking calories that works for you. Get creative. Try different apps or programs if writing down calories does not work for you. What foods should I eat?  Eat nutritious foods. It is better to have a nutritious,  high-calorie food, such as an avocado, than a food with few nutrients, such as a bag of potato chips.  Use your calories on foods and drinks that will fill you up and will not leave you hungry soon after eating. ? Examples of foods that fill you up are nuts and nut butters, vegetables, lean proteins, and high-fiber foods such as whole grains. High-fiber foods are foods with more than 5 g of fiber per serving.  Pay attention to calories in drinks. Low-calorie drinks include water and unsweetened drinks. The items listed above may not be a complete list of foods and beverages you can eat.   Contact a dietitian for more information.   What foods should I limit? Limit foods or drinks that are not good sources of vitamins, minerals, or protein or that are high in unhealthy fats. These include:  Candy.  Other sweets.  Sodas, specialty coffee drinks, alcohol, and juice. The items listed above may not be a complete list of foods and beverages you should avoid. Contact a dietitian for more information. How do I count calories when eating out?  Pay attention to portions. Often, portions are much larger when eating out. Try these tips to keep portions smaller: ? Consider sharing a meal instead of getting your own. ? If you get your own meal, eat only half of it. Before you start eating, ask for a container and put half of your meal into it. ? When available, consider ordering smaller portions from the menu instead of full portions.  Pay attention to your food and drink choices. Knowing the way food is cooked and what is included with the meal can help you eat fewer calories. ? If calories are listed on the menu, choose the lower-calorie options. ? Choose dishes that include vegetables, fruits, whole grains, low-fat dairy products, and lean proteins. ? Choose items that are boiled, broiled, grilled, or steamed. Avoid items that are buttered, battered, fried, or served with cream sauce. Items labeled as  crispy are usually fried, unless stated otherwise. ? Choose water, low-fat milk, unsweetened iced tea, or other drinks without added sugar. If you want an alcoholic beverage, choose a lower-calorie option, such as a glass of wine or light beer. ? Ask for dressings, sauces, and syrups on the side. These are usually high in calories, so you should limit the amount you eat. ? If you want a salad, choose a garden salad and ask for grilled meats. Avoid extra toppings such as bacon, cheese, or fried items. Ask for the dressing on the side, or ask for olive oil and vinegar or lemon to use as dressing.  Estimate how many servings of a food you are given. Knowing serving sizes will help you be aware of how much food you are eating at restaurants. Where to find more information  Centers for Disease Control and Prevention: www.cdc.gov  [place] Department of Agriculture: myplate.gov Summary  Calorie counting means keeping track of how many calories you eat and drink each day. If you eat fewer calories than your body needs, you should lose weight.  A healthy amount of weight to lose per week is usually 1-2 lb (0.5-0.9 kg). This usually means reducing your daily calorie intake by 500-750 calories.  The number of calories in a food can be found on a Nutrition Facts label. If a food does not have a Nutrition Facts label, try to look up the calories online or ask your dietitian for help.  Use smaller plates, glasses, and bowls for smaller portions and to prevent overeating.  Use your calories on foods and drinks that will fill you up and not leave you hungry shortly after a meal. This information is not intended to replace advice given to you by your health care provider. Make sure you discuss any questions you have with your health care provider. Document Revised: 11/05/2019 Document Reviewed: 11/05/2019 Elsevier Patient Education  2021 Elsevier Inc.  

## 2021-01-22 DIAGNOSIS — E559 Vitamin D deficiency, unspecified: Secondary | ICD-10-CM | POA: Insufficient documentation

## 2021-01-22 DIAGNOSIS — R635 Abnormal weight gain: Secondary | ICD-10-CM | POA: Insufficient documentation

## 2021-01-22 DIAGNOSIS — Z0000 Encounter for general adult medical examination without abnormal findings: Secondary | ICD-10-CM | POA: Insufficient documentation

## 2021-01-22 DIAGNOSIS — Z7689 Persons encountering health services in other specified circumstances: Secondary | ICD-10-CM | POA: Insufficient documentation

## 2021-01-22 NOTE — Progress Notes (Signed)
Labs good. Discuss at next visit

## 2021-01-27 LAB — COMPREHENSIVE METABOLIC PANEL
ALT: 15 IU/L (ref 0–32)
AST: 14 IU/L (ref 0–40)
Albumin/Globulin Ratio: 2.3 — ABNORMAL HIGH (ref 1.2–2.2)
Albumin: 4.6 g/dL (ref 3.8–4.8)
Alkaline Phosphatase: 84 IU/L (ref 44–121)
BUN/Creatinine Ratio: 11 (ref 9–23)
BUN: 9 mg/dL (ref 6–20)
Bilirubin Total: 0.9 mg/dL (ref 0.0–1.2)
CO2: 19 mmol/L — ABNORMAL LOW (ref 20–29)
Calcium: 9 mg/dL (ref 8.7–10.2)
Chloride: 102 mmol/L (ref 96–106)
Creatinine, Ser: 0.83 mg/dL (ref 0.57–1.00)
Globulin, Total: 2 g/dL (ref 1.5–4.5)
Glucose: 88 mg/dL (ref 65–99)
Potassium: 4.4 mmol/L (ref 3.5–5.2)
Sodium: 138 mmol/L (ref 134–144)
Total Protein: 6.6 g/dL (ref 6.0–8.5)
eGFR: 94 mL/min/{1.73_m2} (ref >59)

## 2021-01-27 LAB — CBC WITH DIFFERENTIAL/PLATELET
Basophils Absolute: 0 10*3/uL (ref 0.0–0.2)
Basos: 0 %
EOS (ABSOLUTE): 0.1 10*3/uL (ref 0.0–0.4)
Eos: 2 %
Hematocrit: 39.6 % (ref 34.0–46.6)
Hemoglobin: 13.4 g/dL (ref 11.1–15.9)
Immature Grans (Abs): 0 10*3/uL (ref 0.0–0.1)
Immature Granulocytes: 0 %
Lymphocytes Absolute: 2 10*3/uL (ref 0.7–3.1)
Lymphs: 29 %
MCH: 30.9 pg (ref 26.6–33.0)
MCHC: 33.8 g/dL (ref 31.5–35.7)
MCV: 92 fL (ref 79–97)
Monocytes Absolute: 0.7 10*3/uL (ref 0.1–0.9)
Monocytes: 10 %
Neutrophils Absolute: 4.2 10*3/uL (ref 1.4–7.0)
Neutrophils: 59 %
Platelets: 349 10*3/uL (ref 150–450)
RBC: 4.33 x10E6/uL (ref 3.77–5.28)
RDW: 12.6 % (ref 11.7–15.4)
WBC: 7.1 10*3/uL (ref 3.4–10.8)

## 2021-01-27 LAB — LIPID PANEL
Chol/HDL Ratio: 2.7 ratio (ref 0.0–4.4)
Cholesterol, Total: 148 mg/dL (ref 100–199)
HDL: 54 mg/dL (ref >39)
LDL Chol Calc (NIH): 80 mg/dL (ref 0–99)
Triglycerides: 69 mg/dL (ref 0–149)
VLDL Cholesterol Cal: 14 mg/dL (ref 5–40)

## 2021-01-27 LAB — VITAMIN D 1,25 DIHYDROXY
Vitamin D 1, 25 (OH)2 Total: 40 pg/mL
Vitamin D2 1, 25 (OH)2: <10 pg/mL
Vitamin D3 1, 25 (OH)2: 40 pg/mL

## 2021-01-27 LAB — T4, FREE: Free T4: 1.3 ng/dL (ref 0.82–1.77)

## 2021-01-27 LAB — TSH: TSH: 3.01 u[IU]/mL (ref 0.450–4.500)

## 2021-01-29 ENCOUNTER — Ambulatory Visit
Admission: EM | Admit: 2021-01-29 | Discharge: 2021-01-29 | Disposition: A | Discharge status: Home / Self Care | Payer: Managed Care, Other (non HMO) | Attending: Emergency Medicine | Admitting: Emergency Medicine

## 2021-01-29 ENCOUNTER — Other Ambulatory Visit: Payer: Self-pay

## 2021-01-29 DIAGNOSIS — L237 Allergic contact dermatitis due to plants, except food: Secondary | ICD-10-CM

## 2021-01-29 MED ORDER — HYDROXYZINE HCL 25 MG PO TABS: 25.0000 mg | ORAL_TABLET | Freq: Four times a day (QID) | ORAL | 0 refills | Status: AC | PRN

## 2021-01-29 MED ORDER — TRIAMCINOLONE ACETONIDE 0.025 % EX OINT: 1.0000 "application " | TOPICAL_OINTMENT | Freq: Two times a day (BID) | CUTANEOUS | 0 refills | Status: AC

## 2021-01-29 MED ORDER — PREDNISONE 20 MG PO TABS: ORAL_TABLET | ORAL | 0 refills | Status: DC

## 2021-01-29 MED ORDER — MUPIROCIN 2 % EX OINT: 1.0000 "application " | TOPICAL_OINTMENT | Freq: Two times a day (BID) | CUTANEOUS | 0 refills | Status: DC

## 2021-01-29 NOTE — ED Provider Notes (Signed)
HPI  SUBJECTIVE:  Kaylee Rodgers is a 35 y.o. female who presents with an erythematous, vesicular, pruritic rash starting about a week ago after taking down a tree that has known poison ivy on it.  She states that the rash is present on the left wrist, back, neck, and between fingers of her right hand.  She had re-exposure to poison ivy yesterday when she was disposing of the tree.  States that she had right eye itching this morning, no eye pain, periorbital erythema, edema, visual changes.  She has been using triamcinolone as needed with improvement in her symptoms.  Symptoms are worse when she gets hot.  Past medical history negative for diabetes, MRSA.  LMP: 2 days ago.  Denies possibility of being pregnant.  JIR:CVELFY, Kathlynn Grate, NP     Past Medical History:  Diagnosis Date  . Allergy     Past Surgical History:  Procedure Laterality Date  . TONSILLECTOMY      Family History  Problem Relation Age of Onset  . Arthritis/Rheumatoid Mother   . Diabetes Mother   . Hypertension Father   . Alcoholism Father   . High blood pressure Father   . Diabetes Maternal Grandmother   . Cancer Paternal Grandmother   . High Cholesterol Paternal Grandmother   . High blood pressure Paternal Grandfather     Social History   Tobacco Use  . Smoking status: Former Smoker    Quit date: 11/12/2005    Years since quitting: 15.2  . Smokeless tobacco: Never Used  Vaping Use  . Vaping Use: Never used  Substance Use Topics  . Alcohol use: Yes    Comment: socially  . Drug use: Never    No current facility-administered medications for this encounter.  Current Outpatient Medications:  .  Cholecalciferol (VITAMIN D3) 1000 units CAPS, Take by mouth., Disp: , Rfl:  .  hydrOXYzine (ATARAX/VISTARIL) 25 MG tablet, Take 1 tablet (25 mg total) by mouth every 6 (six) hours as needed for up to 10 days for itching., Disp: 30 tablet, Rfl: 0 .  Multiple Vitamins-Minerals (EMERGEN-C IMMUNE PLUS/VIT D) CHEW,  Chew 1 tablet by mouth daily., Disp: , Rfl:  .  mupirocin ointment (BACTROBAN) 2 %, Apply 1 application topically 2 (two) times daily., Disp: 22 g, Rfl: 0 .  phentermine (ADIPEX-P) 37.5 MG tablet, Take 1 tablet (37.5 mg total) by mouth daily before breakfast., Disp: 30 tablet, Rfl: 1 .  predniSONE (DELTASONE) 20 MG tablet, 3 Tabs PO Days 1-3, then 2 tabs PO Days 4-6, then 1 tab PO Day 7-9, then Half Tab PO Day 10-12, Disp: 20 tablet, Rfl: 0 .  triamcinolone (KENALOG) 0.025 % ointment, Apply 1 application topically 2 (two) times daily for 14 days., Disp: 30 g, Rfl: 0 .  cetirizine (ZYRTEC) 10 MG tablet, Take 10 mg by mouth daily., Disp: , Rfl:   No Known Allergies   ROS  As noted in HPI.   Physical Exam  BP 126/90 (BP Location: Left Arm)   Pulse 67   Temp 98.2 F (36.8 C) (Oral)   Resp 18   Ht 6' (1.829 m)   Wt 120.2 kg   LMP 01/27/2021   SpO2 100%   BMI 35.94 kg/m   Constitutional: Well developed, well nourished, no acute distress Eyes:  EOMI, conjunctiva normal bilaterally.  No periorbital erythema, edema, rash. HENT: Normocephalic, atraumatic,mucus membranes moist Respiratory: Normal inspiratory effort Cardiovascular: Normal rate GI: nondistended skin: Erythematous mildly tender vesicular rash on the  dorsum of the left wrist.  No crusting     vesicular rash in between the right index and middle finger    Erythematous rash on the back     Musculoskeletal: no deformities Neurologic: Alert & oriented x 3, no focal neuro deficits Psychiatric: Speech and behavior appropriate   ED Course   Medications - No data to display  No orders of the defined types were placed in this encounter.   No results found for this or any previous visit (from the past 24 hour(s)). No results found.  ED Clinical Impression  1. Poison ivy dermatitis      ED Assessment/Plan  Patient with a poison ivy dermatitis.  She had reexposure yesterday.  It is not yet widespread.  Will  try triamcinolone ointment, Claritin or Zyrtec, and if that does not work, then Atarax.  Bactroban to help prevent infection.  Wait-and-see prescription of 12-day prednisone taper if it gets worse/more widespread.  Meds ordered this encounter  Medications  . predniSONE (DELTASONE) 20 MG tablet    Sig: 3 Tabs PO Days 1-3, then 2 tabs PO Days 4-6, then 1 tab PO Day 7-9, then Half Tab PO Day 10-12    Dispense:  20 tablet    Refill:  0  . hydrOXYzine (ATARAX/VISTARIL) 25 MG tablet    Sig: Take 1 tablet (25 mg total) by mouth every 6 (six) hours as needed for up to 10 days for itching.    Dispense:  30 tablet    Refill:  0  . mupirocin ointment (BACTROBAN) 2 %    Sig: Apply 1 application topically 2 (two) times daily.    Dispense:  22 g    Refill:  0  . triamcinolone (KENALOG) 0.025 % ointment    Sig: Apply 1 application topically 2 (two) times daily for 14 days.    Dispense:  30 g    Refill:  0      *This clinic note was created using Scientist, clinical (histocompatibility and immunogenetics). Therefore, there may be occasional mistakes despite careful proofreading.  ?    Domenick Gong, MD 01/29/21 [phone]

## 2021-01-29 NOTE — Discharge Instructions (Addendum)
Discontinue your Allegra.  Start Claritin or Zyrtec.  If that does not work, then you can use the Atarax.  Try the triamcinolone ointment first, and if this becomes uncontrollably itchy or if it becomes widespread, then start the prednisone 12-day taper.  Bactroban or another antibiotic ointment to help prevent secondary infection.

## 2021-01-29 NOTE — ED Triage Notes (Signed)
Pt c/o possible poison ivy. She has patches of rash on her wrists, hands, arms and back. She states she has been taking down a tree and was exposed. Pt also reports her right eye has been itching and was slightly swollen and teary this morning. Pt has not taken any OTC meds for the rash but did apply some cream to the areas.

## 2021-02-16 ENCOUNTER — Other Ambulatory Visit: Payer: Self-pay

## 2021-02-16 ENCOUNTER — Encounter: Payer: Self-pay | Admitting: Nurse Practitioner

## 2021-02-16 ENCOUNTER — Ambulatory Visit (INDEPENDENT_AMBULATORY_CARE_PROVIDER_SITE_OTHER): Payer: Managed Care, Other (non HMO) | Admitting: Nurse Practitioner

## 2021-02-16 VITALS — BP 111/74 | HR 72 | Temp 98.5°F | Ht 72.0 in | Wt 268.8 lb

## 2021-02-16 DIAGNOSIS — G43009 Migraine without aura, not intractable, without status migrainosus: Secondary | ICD-10-CM

## 2021-02-16 DIAGNOSIS — Z6836 Body mass index (BMI) 36.0-36.9, adult: Secondary | ICD-10-CM

## 2021-02-16 MED ORDER — PHENTERMINE HCL 37.5 MG PO TABS: 37.5000 mg | ORAL_TABLET | Freq: Every day | ORAL | 1 refills | Status: DC

## 2021-02-16 MED ORDER — UBRELVY 100 MG PO TABS: 100.0000 mg | ORAL_TABLET | Freq: Every day | ORAL | 3 refills | Status: DC | PRN

## 2021-02-16 NOTE — Progress Notes (Signed)
Established Patient Office Visit  Subjective:  Patient ID: Kaylee Rodgers, female    DOB: November 23, 1985  Age: 35 y.o. MRN: [id number]  CC:  Chief Complaint  Patient presents with  . Follow-up    HPI Kaylee Rodgers presents for follow up of weight management and labs. She was started on phentermine at her initial visit to this office. She has done well. She has had an eight pound weight loss since she was seen. She is doing well with portion control and reduction of calories. Increasing exercise and physical activities has been difficult. She states that she has hada little trouble sleeping since starting on phentermine without other negative side effects. She has had some increased family related stress. She did take a break from the medication for several days when she was treated with prednisone taper for significant episode of poison ivy. She continues to have some intermittent migraine headaches. Samples of ubrelvy did work very well and states that prescription never went through to her pharmacy. Will checkthis today.  She had routine, fasting labs done prior to this visit. All labs were within normal limits.  She denies chest pain, chest pressure, or shortness of breath. She denies headaches or visual disturbances. She denies abdominal pain, nausea, vomiting, or changes in bowel or bladder habits.   Past Medical History:  Diagnosis Date  . Allergy     Past Surgical History:  Procedure Laterality Date  . TONSILLECTOMY      Family History  Problem Relation Age of Onset  . Arthritis/Rheumatoid Mother   . Diabetes Mother   . Hypertension Father   . Alcoholism Father   . High blood pressure Father   . Diabetes Maternal Grandmother   . Cancer Paternal Grandmother   . High Cholesterol Paternal Grandmother   . High blood pressure Paternal Grandfather     Social History   Socioeconomic History  . Marital status: Married    Spouse name: Not on file  . Number of  children: Not on file  . Years of education: Not on file  . Highest education level: Not on file  Occupational History  . Not on file  Tobacco Use  . Smoking status: Former Smoker    Quit date: 11/12/2005    Years since quitting: 15.3  . Smokeless tobacco: Never Used  Vaping Use  . Vaping Use: Never used  Substance and Sexual Activity  . Alcohol use: Yes    Comment: socially  . Drug use: Never  . Sexual activity: Yes    Partners: Female  Other Topics Concern  . Not on file  Social History Narrative  . Not on file   Social Determinants of Health   Financial Resource Strain: Not on file  Food Insecurity: Not on file  Transportation Needs: Not on file  Physical Activity: Not on file  Stress: Not on file  Social Connections: Not on file  Intimate Partner Violence: Not on file    Outpatient Medications Prior to Visit  Medication Sig Dispense Refill  . cetirizine (ZYRTEC) 10 MG tablet Take 10 mg by mouth daily.    . Cholecalciferol (VITAMIN D3) 1000 units CAPS Take by mouth.    . Multiple Vitamins-Minerals (EMERGEN-C IMMUNE PLUS/VIT D) CHEW Chew 1 tablet by mouth daily.    . mupirocin ointment (BACTROBAN) 2 % Apply 1 application topically 2 (two) times daily. 22 g 0  . phentermine (ADIPEX-P) 37.5 MG tablet Take 1 tablet (37.5 mg total) by mouth daily before  breakfast. 30 tablet 1  . predniSONE (DELTASONE) 20 MG tablet 3 Tabs PO Days 1-3, then 2 tabs PO Days 4-6, then 1 tab PO Day 7-9, then Half Tab PO Day 10-12 20 tablet 0   No facility-administered medications prior to visit.    No Known Allergies  ROS Review of Systems  Constitutional: Negative for chills, fatigue and fever.       Eight pound weight loss since initial visit.   HENT: Negative for congestion, postnasal drip, rhinorrhea, sinus pressure and sinus pain.   Eyes: Negative.   Respiratory: Negative for cough, chest tightness, shortness of breath and wheezing.   Cardiovascular: Negative for chest pain and  palpitations.  Gastrointestinal: Negative for constipation, diarrhea, nausea and vomiting.  Endocrine: Negative for cold intolerance, heat intolerance, polydipsia and polyuria.  Musculoskeletal: Negative for arthralgias, back pain and myalgias.  Skin: Negative for rash.  Allergic/Immunologic: Negative.   Neurological: Negative for dizziness, weakness and headaches.  Hematological: Negative for adenopathy.  Psychiatric/Behavioral: Positive for sleep disturbance. The patient is not nervous/anxious.       Objective:    Physical Exam Vitals and nursing note reviewed.  Constitutional:      Appearance: Normal appearance. She is well-developed. She is obese.  HENT:     Head: Normocephalic and atraumatic.     Nose: Nose normal.  Eyes:     Extraocular Movements: Extraocular movements intact.     Conjunctiva/sclera: Conjunctivae normal.     Pupils: Pupils are equal, round, and reactive to light.  Cardiovascular:     Rate and Rhythm: Normal rate and regular rhythm.     Pulses: Normal pulses.     Heart sounds: Normal heart sounds.  Pulmonary:     Effort: Pulmonary effort is normal.     Breath sounds: Normal breath sounds.  Abdominal:     Palpations: Abdomen is soft.  Musculoskeletal:        General: Normal range of motion.     Cervical back: Normal range of motion and neck supple.  Skin:    General: Skin is warm and dry.     Capillary Refill: Capillary refill takes less than 2 seconds.  Neurological:     General: No focal deficit present.     Mental Status: She is alert and oriented to person, place, and time.  Psychiatric:        Mood and Affect: Mood normal.        Behavior: Behavior normal.        Thought Content: Thought content normal.        Judgment: Judgment normal.     Today's Vitals   02/16/21 1532  BP: 111/74  Pulse: 72  Temp: 98.5 F (36.9 C)  SpO2: 99%  Weight: 268 lb 12.8 oz (121.9 kg)  Height: 6' (1.829 m)   Body mass index is 36.46 kg/m.   Wt  Readings from Last 3 Encounters:  02/16/21 268 lb 12.8 oz (121.9 kg)  01/29/21 265 lb (120.2 kg)  01/17/21 276 lb (125.2 kg)     Health Maintenance Due  Topic Date Due  . HIV Screening  Never done  . TETANUS/TDAP  Never done    There are no preventive care reminders to display for this patient.  Lab Results  Component Value Date   TSH 3.010 01/20/2021   Lab Results  Component Value Date   WBC 7.1 01/20/2021   HGB 13.4 01/20/2021   HCT 39.6 01/20/2021   MCV 92 01/20/2021  PLT 349 01/20/2021   Lab Results  Component Value Date   NA 138 01/20/2021   K 4.4 01/20/2021   CO2 19 (L) 01/20/2021   GLUCOSE 88 01/20/2021   BUN 9 01/20/2021   CREATININE 0.83 01/20/2021   BILITOT 0.9 01/20/2021   ALKPHOS 84 01/20/2021   AST 14 01/20/2021   ALT 15 01/20/2021   PROT 6.6 01/20/2021   ALBUMIN 4.6 01/20/2021   CALCIUM 9.0 01/20/2021   ANIONGAP 6 (L) 09/20/2014   EGFR 94 01/20/2021   Lab Results  Component Value Date   CHOL 148 01/20/2021   Lab Results  Component Value Date   HDL 54 01/20/2021   Lab Results  Component Value Date   LDLCALC 80 01/20/2021   Lab Results  Component Value Date   TRIG 69 01/20/2021   Lab Results  Component Value Date   CHOLHDL 2.7 01/20/2021   No results found for: HGBA1C    Assessment & Plan:  1. Migraine without aura and without status migrainosus, not intractable Added in prescription for ubreovy 166m tablets. She may take this as needed and as prescribed for acute migraine headaches.  New prescription sent to her pharmacy today.  - Ubrogepant (UBRELVY) 100 MG TABS; Take 100 mg by mouth daily as needed.  Dispense: 10 tablet; Refill: 3  2. BMI 36.0-36.9,adult Improving. Renew prescription for phentermine 97.529mtablets daily. Limit calorie intake to <1500 calories per day. Gradually incorporate physical activity into daily routine.  - phentermine (ADIPEX-P) 37.5 MG tablet; Take 1 tablet (37.5 mg total) by mouth daily before  breakfast.  Dispense: 30 tablet; Refill: 1  Problem List Items Addressed This Visit      Cardiovascular and Mediastinum   Migraine without aura and without status migrainosus, not intractable - Primary   Relevant Medications   Ubrogepant (UBRELVY) 100 MG TABS     Other   BMI 36.0-36.9,adult   Relevant Medications   phentermine (ADIPEX-P) 37.5 MG tablet      Meds ordered this encounter  Medications  . phentermine (ADIPEX-P) 37.5 MG tablet    Sig: Take 1 tablet (37.5 mg total) by mouth daily before breakfast.    Dispense:  30 tablet    Refill:  1    Order Specific Question:   Supervising Provider    Answer:   MEBeatrice Lecher [[id number]]  . Ubrogepant (UBRELVY) 100 MG TABS    Sig: Take 100 mg by mouth daily as needed.    Dispense:  10 tablet    Refill:  3    Order Specific Question:   Supervising Provider    Answer:   MEBeatrice Lecher [[id number]]    Follow-up: Return in about 4 weeks (around 03/16/2021) for weight management.    HeRonnell FreshwaterNP

## 2021-02-16 NOTE — Patient Instructions (Signed)
Chronic Migraine Headache A migraine headache is throbbing pain that is usually on one side of the head. Migraines that keep coming back are called recurring migraines. A migraine is called a chronic migraine if it happens at least 15 days in a month for more than 3 months. Talk with your doctor about what things may bring on (trigger) your migraines. What are the causes? The exact cause of this condition is not known. A migraine may be caused when nerves in the brain become irritated and release chemicals that cause irritation and swelling (inflammation) of blood vessels. The irritation and swelling of the blood vessels causes pain. Migraines may be brought on or caused by:  Smoking.  Foods and drinks, such as: ? Cheese. ? Chocolate. ? Alcohol. ? Caffeine.  Certain substances in some foods or drinks.  Some medicines. Other things that may bring on a migraine include:  Periods, for women.  Stress.  Not enough sleep or too much sleep.  Feeling very tired.  Bright lights or loud noises.  Smells  Weather changes and being at high altitude. What increases the risk? The following factors may make you more likely to have chronic migraine:  Having migraines or family members who have them.  Being very sad (depressed) or feeling worried or nervous (anxious).  Taking a lot of pain medicine.  Having problems sleeping.  Having heart disease, diabetes, or being very overweight (obese). What are the signs or symptoms? Symptoms of this condition include:  Pain that feels like it throbs.  Pain that is usually only on one side of the head. In some cases, the pain may be on both sides of the head or around the head or neck.  Very bad pain that keeps you from doing daily activities.  Pain that gets worse with activity.  Feeling like you may vomit (feeling nauseous) or vomiting.  Pain when you are around bright lights, loud noises, or activity.  Being sensitive to bright  lights, loud noises, or smells.  Feeling dizzy. How is this treated? This condition is treated with:  Medicines. These help to: ? Lessen pain and the feeling like you may vomit. ? Prevent migraines.  Changes to your diet or sleep.  Therapy. This might include: ? Relaxation training. ? Biofeedback. This is a treatment that teaches you to relax, use your brain to lower your heart rate, and control your breathing. ? Cognitive behavioral therapy (CBT). This therapy helps you set goals and follow up on the changes that you make.  Acupuncture.  Using a device that provides electrical stimulation to your nerves, which can help take away pain.  Surgery, if the other treatments do not work. Follow these instructions at home: Medicines  Take over-the-counter and prescription medicines only as told by your doctor.  Ask your doctor if the medicine prescribed to you requires you to avoid driving or using machinery. Lifestyle  Do not use any products that contain nicotine or tobacco, such as cigarettes, e-cigarettes, and chewing tobacco. If you need help quitting, ask your doctor.  Do not drink alcohol.  Get 7-9 hours of sleep each night.  Lower the stress in your life. Ask your doctor about ways to do this.  Stay at a healthy weight. Talk with your doctor if you need help losing weight.  Get regular exercise.   General instructions  Keep a journal to find out if certain things bring on migraines. For example, write down: ? What you eat and drink. ? How   much sleep you get. ? Any change to your diet or medicines.  Lie down in a dark, quiet room when you have a migraine.  Try placing a cool towel over your head when you have a migraine.  Keep lights dim if bright lights bother you or make your migraines worse.  Keep all follow-up visits as told by your doctor. This is important.   Where to find more information  Coalition for Headache and Migraine Patients (CHAMP):  headachemigraine.org  American Migraine Foundation: americanmigrainefoundation.org  National Headache Foundation: headaches.org Contact a doctor if:  Medicine does not help your migraine.  Your pain keeps coming back. Get help right away if:  Your migraine becomes really bad and medicine does not help.  You have a stiff neck and fever.  You have trouble seeing.  Your muscles are weak or you lose control of them.  You lose your balance or have trouble walking.  You feel like you will faint or you faint.  You start having sudden, very bad headaches.  You have a seizure. Summary  A migraine headache is very bad, throbbing pain that is usually on one side of the head.  A chronic migraine is a migraine that happens 15 days in a month for more than 3 months.  Talk with your doctor about what things may bring on your migraines.  Lie down in a dark, quiet room when you have a migraine.  Keep a journal. This can help you find out if certain things make you have migraines. This information is not intended to replace advice given to you by your health care provider. Make sure you discuss any questions you have with your health care provider. Document Revised: 11/11/2019 Document Reviewed: 11/11/2019 Elsevier Patient Education  2021 [employer] Inc.  

## 2021-03-16 ENCOUNTER — Encounter: Payer: Self-pay | Admitting: Nurse Practitioner

## 2021-03-16 ENCOUNTER — Ambulatory Visit: Payer: Managed Care, Other (non HMO) | Admitting: Nurse Practitioner

## 2021-03-16 ENCOUNTER — Other Ambulatory Visit: Payer: Self-pay

## 2021-03-16 VITALS — BP 115/73 | HR 82 | Temp 96.5°F | Ht 72.0 in | Wt 262.3 lb

## 2021-03-16 DIAGNOSIS — F410 Panic disorder [episodic paroxysmal anxiety] without agoraphobia: Secondary | ICD-10-CM | POA: Insufficient documentation

## 2021-03-16 DIAGNOSIS — Z6835 Body mass index (BMI) 35.0-35.9, adult: Secondary | ICD-10-CM | POA: Diagnosis not present

## 2021-03-16 DIAGNOSIS — M19042 Primary osteoarthritis, left hand: Secondary | ICD-10-CM

## 2021-03-16 MED ORDER — PHENTERMINE HCL 37.5 MG PO TABS: 37.5000 mg | ORAL_TABLET | Freq: Every day | ORAL | 1 refills | Status: DC

## 2021-03-16 MED ORDER — HYDROXYZINE HCL 25 MG PO TABS: ORAL_TABLET | ORAL | 1 refills | Status: DC

## 2021-03-16 NOTE — Progress Notes (Signed)
Established Patient Office Visit  Subjective:  Patient ID: Kaylee Rodgers, female    DOB: Nov 12, 1985  Age: 35 y.o. MRN: [id number]  CC:  Chief Complaint  Patient presents with   Weight Check    HPI Kaylee SHIMMIN presents for evaluation of weight loss. She has lost 6 pounds since her last visit. She has lost a total of 14 pound since restarting on phentermine in 01/2021. She states that she is exercising more frequently, by walking outdoors several days each week. She is also cooking meals at home more often. Most weeks she is preparing meals at least four times per week. She reports increased thirst while taking phentermine and is drinking more water. She denies other negative side effects associated with taking this medication.  She states that last week she did have a panic attack. She states that during a panic attack, she gets a strange sensation in her mind. Her fingers get tingly, she gets short of breath, and got a headache. She states that she used to get these frequently when in her 11s. Her provider at the time tried to put on her on paxil which made her feel more anxious. She was also given a low dose of alprazolam. She states that dose was 0.50m which she could take if needed. She states that it did make her sleepy so she rarely took it. She states that in her 227s panic attacks were mostly related to work. She started keeping a photo of her home at her desk, and focusing on the photo would help her to calm her mind and keep from developing a full blown panic attack. She states that she is now working from home. She reports increased work related stress, but unable to focus on that photo as work and home are the same place. She states that last week was the first panic attack she had in years and she has not had one since. She denies chest pain or pressure during this time.    Past Medical History:  Diagnosis Date   Allergy     Past Surgical History:  Procedure  Laterality Date   TONSILLECTOMY      Family History  Problem Relation Age of Onset   Arthritis/Rheumatoid Mother    Diabetes Mother    Hypertension Father    Alcoholism Father    High blood pressure Father    Diabetes Maternal Grandmother    Cancer Paternal Grandmother    High Cholesterol Paternal Grandmother    High blood pressure Paternal Grandfather     Social History   Socioeconomic History   Marital status: Married    Spouse name: Not on file   Number of children: Not on file   Years of education: Not on file   Highest education level: Not on file  Occupational History   Not on file  Tobacco Use   Smoking status: Former    Pack years: 0.00    Types: Cigarettes    Quit date: 11/12/2005    Years since quitting: 15.3   Smokeless tobacco: Never  Vaping Use   Vaping Use: Never used  Substance and Sexual Activity   Alcohol use: Yes    Comment: socially   Drug use: Never   Sexual activity: Yes    Partners: Female  Other Topics Concern   Not on file  Social History Narrative   Not on file   Social Determinants of Health   Financial Resource Strain: Not on file  Food Insecurity: Not on file  Transportation Needs: Not on file  Physical Activity: Not on file  Stress: Not on file  Social Connections: Not on file  Intimate Partner Violence: Not on file    Outpatient Medications Prior to Visit  Medication Sig Dispense Refill   Cholecalciferol (VITAMIN D3) 1000 units CAPS Take by mouth.     Multiple Vitamins-Minerals (EMERGEN-C IMMUNE PLUS/VIT D) CHEW Chew 1 tablet by mouth daily.     mupirocin ointment (BACTROBAN) 2 % Apply 1 application topically 2 (two) times daily. 22 g 0   Ubrogepant (UBRELVY) 100 MG TABS Take 100 mg by mouth daily as needed. 10 tablet 3   phentermine (ADIPEX-P) 37.5 MG tablet Take 1 tablet (37.5 mg total) by mouth daily before breakfast. 30 tablet 1   cetirizine (ZYRTEC) 10 MG tablet Take 10 mg by mouth daily.     No facility-administered  medications prior to visit.    No Known Allergies  ROS Review of Systems  Constitutional:  Negative for activity change, appetite change, chills, fatigue and fever.       Weight loss of 6 pounds since most recent visit. Has lost 14 pounds since starting back on phentermine in 01/2021   HENT:  Negative for congestion, postnasal drip, rhinorrhea and sinus pressure.   Eyes: Negative.   Respiratory:  Negative for chest tightness and shortness of breath.   Cardiovascular:  Negative for chest pain and palpitations.  Gastrointestinal:  Negative for diarrhea, nausea and vomiting.  Endocrine: Negative for cold intolerance, heat intolerance, polydipsia and polyuria.  Genitourinary: Negative.   Musculoskeletal:  Positive for arthralgias. Negative for back pain and myalgias.       Has noted development of pain and swelling of the middle joint of middle finger of left hand. States that finger will get tight and difficult to open and close the fingers of her hand .  Skin:  Negative for rash.  Allergic/Immunologic: Negative.   Neurological:  Negative for dizziness, weakness and headaches.  Psychiatric/Behavioral:  Negative for dysphoric mood and sleep disturbance. The patient is nervous/anxious.      Objective:    Physical Exam Vitals and nursing note reviewed.  Constitutional:      Appearance: Normal appearance. She is well-developed.  HENT:     Head: Normocephalic and atraumatic.     Nose: Nose normal.     Mouth/Throat:     Mouth: Mucous membranes are moist.  Eyes:     Extraocular Movements: Extraocular movements intact.     Conjunctiva/sclera: Conjunctivae normal.     Pupils: Pupils are equal, round, and reactive to light.  Cardiovascular:     Rate and Rhythm: Normal rate and regular rhythm.     Pulses: Normal pulses.     Heart sounds: Normal heart sounds.  Pulmonary:     Effort: Pulmonary effort is normal.     Breath sounds: Normal breath sounds.  Abdominal:     Palpations: Abdomen  is soft.  Musculoskeletal:        General: Normal range of motion.     Cervical back: Normal range of motion and neck supple.  Lymphadenopathy:     Cervical: No cervical adenopathy.  Skin:    General: Skin is warm and dry.     Capillary Refill: Capillary refill takes less than 2 seconds.  Neurological:     General: No focal deficit present.     Mental Status: She is alert and oriented to person, place, and time.  Psychiatric:  Mood and Affect: Mood normal.        Behavior: Behavior normal.        Thought Content: Thought content normal.        Judgment: Judgment normal.    Today's Vitals   03/16/21 1439  BP: 115/73  Pulse: 82  Temp: (!) 96.5 F (35.8 C)  SpO2: 99%  Weight: 262 lb 4.8 oz (119 kg)  Height: 6' (1.829 m)   Body mass index is 35.57 kg/m.   Wt Readings from Last 3 Encounters:  03/16/21 262 lb 4.8 oz (119 kg)  02/16/21 268 lb 12.8 oz (121.9 kg)  01/29/21 265 lb (120.2 kg)     Health Maintenance Due  Topic Date Due   HIV Screening  Never done   TETANUS/TDAP  Never done    There are no preventive care reminders to display for this patient.  Lab Results  Component Value Date   TSH 3.010 01/20/2021   Lab Results  Component Value Date   WBC 7.1 01/20/2021   HGB 13.4 01/20/2021   HCT 39.6 01/20/2021   MCV 92 01/20/2021   PLT 349 01/20/2021   Lab Results  Component Value Date   NA 138 01/20/2021   K 4.4 01/20/2021   CO2 19 (L) 01/20/2021   GLUCOSE 88 01/20/2021   BUN 9 01/20/2021   CREATININE 0.83 01/20/2021   BILITOT 0.9 01/20/2021   ALKPHOS 84 01/20/2021   AST 14 01/20/2021   ALT 15 01/20/2021   PROT 6.6 01/20/2021   ALBUMIN 4.6 01/20/2021   CALCIUM 9.0 01/20/2021   ANIONGAP 6 (L) 09/20/2014   EGFR 94 01/20/2021   Lab Results  Component Value Date   CHOL 148 01/20/2021   Lab Results  Component Value Date   HDL 54 01/20/2021   Lab Results  Component Value Date   LDLCALC 80 01/20/2021   Lab Results  Component Value  Date   TRIG 69 01/20/2021   Lab Results  Component Value Date   CHOLHDL 2.7 01/20/2021   No results found for: HGBA1C    Assessment & Plan:  1. Localized primary osteoarthritis of left hand Mild arthritis in medial phalangeal joint of middle finger of left hand. Recommend naproxen 27m daily to help reduce pain and inflammation. May take 2nd dose in 10 to 12 hours as needed for more severe pain.   2. BMI 35.0-35.9,adult Improving. Had six pound weight loss since last visit and 14 pound weight loss since restarting phentermine two months ago. May continue phentermine daily.  Reviewed side effects and risk factors associated with taking stimulant appetite suppressants. She was notified that she must show positive progress torward weight loss without negative side effects to continue for additional 30 days on phentermine. She voiced understanding and agreement with all instructions.   - phentermine (ADIPEX-P) 37.5 MG tablet; Take 1 tablet (37.5 mg total) by mouth daily before breakfast.  Dispense: 30 tablet; Refill: 1  3. Panic attack Trial of hydroxyzine 231m May take 1/2 to 1 tablet once daily as needed for acute anxiety. Will reevaluate at next visit.  - hydrOXYzine (ATARAX/VISTARIL) 25 MG tablet; Take 1/2 to 1 tablet po QD prn acute anxiety  Dispense: 30 tablet; Refill: 1   Problem List Items Addressed This Visit       Musculoskeletal and Integument   Localized primary osteoarthritis of left hand - Primary     Other   BMI 36.0-36.9,adult   Relevant Medications   phentermine (ADIPEX-P) 37.5 MG tablet  Panic attack   Relevant Medications   hydrOXYzine (ATARAX/VISTARIL) 25 MG tablet    Meds ordered this encounter  Medications   phentermine (ADIPEX-P) 37.5 MG tablet    Sig: Take 1 tablet (37.5 mg total) by mouth daily before breakfast.    Dispense:  30 tablet    Refill:  1    Order Specific Question:   Supervising Provider    Answer:   Beatrice Lecher D [[id number]]    hydrOXYzine (ATARAX/VISTARIL) 25 MG tablet    Sig: Take 1/2 to 1 tablet po QD prn acute anxiety    Dispense:  30 tablet    Refill:  1    Order Specific Question:   Supervising Provider    Answer:   Beatrice Lecher D [[id number]]    Follow-up: Return in about 4 weeks (around 04/13/2021) for weight management, anxiety.    Ronnell Freshwater, NP

## 2021-04-17 ENCOUNTER — Ambulatory Visit: Payer: Managed Care, Other (non HMO) | Admitting: Nurse Practitioner

## 2021-04-19 ENCOUNTER — Ambulatory Visit: Payer: Managed Care, Other (non HMO) | Admitting: Nurse Practitioner

## 2021-04-19 ENCOUNTER — Encounter: Payer: Self-pay | Admitting: Nurse Practitioner

## 2021-04-19 ENCOUNTER — Other Ambulatory Visit: Payer: Self-pay

## 2021-04-19 VITALS — BP 125/84 | HR 72 | Temp 99.4°F | Ht 72.0 in | Wt 261.2 lb

## 2021-04-19 DIAGNOSIS — Z6835 Body mass index (BMI) 35.0-35.9, adult: Secondary | ICD-10-CM

## 2021-04-19 DIAGNOSIS — R635 Abnormal weight gain: Secondary | ICD-10-CM | POA: Diagnosis not present

## 2021-04-19 MED ORDER — PHENTERMINE HCL 37.5 MG PO TABS: 37.5000 mg | ORAL_TABLET | Freq: Every day | ORAL | 0 refills | Status: DC

## 2021-04-19 NOTE — Patient Instructions (Signed)
Calorie Counting for Weight Loss Calories are units of energy. Your body needs a certain number of calories from food to keep going throughout the day. When you eat or drink more calories than your body needs, your body stores the extra calories mostly as fat. When you eat or drink fewer calories than your body needs, your body burns fat to getthe energy it needs. Calorie counting means keeping track of how many calories you eat and drink each day. Calorie counting can be helpful if you need to lose weight. If you eat fewer calories than your body needs, you should lose weight. Ask yourhealth care provider what a healthy weight is for you. For calorie counting to work, you will need to eat the right number of calories each day to lose a healthy amount of weight per week. A dietitian can help you figure out how many calories you need in a day and will suggest ways to reach your calorie goal. A healthy amount of weight to lose each week is usually 1-2 lb (0.5-0.9 kg). This usually means that your daily calorie intake should be reduced by 500-750 calories. Eating 1,200-1,500 calories a day can help most women lose weight. Eating 1,500-1,800 calories a day can help most men lose weight. What do I need to know about calorie counting? Work with your health care provider or dietitian to determine how many calories you should get each day. To meet your daily calorie goal, you will need to: Find out how many calories are in each food that you would like to eat. Try to do this before you eat. Decide how much of the food you plan to eat. Keep a food log. Do this by writing down what you ate and how many calories it had. To successfully lose weight, it is important to balance calorie counting with ahealthy lifestyle that includes regular activity. Where do I find calorie information?  The number of calories in a food can be found on a Nutrition Facts label. If a food does not have a Nutrition Facts label, try to  look up the calories onlineor ask your dietitian for help. Remember that calories are listed per serving. If you choose to have more than one serving of a food, you will have to multiply the calories per serving by the number of servings you plan to eat. For example, the label on a package of bread might say that a serving size is 1 slice and that there are 90 calories in a serving. If you eat 1 slice, you will have eaten 90 calories. If you eat 2slices, you will have eaten 180 calories. How do I keep a food log? After each time that you eat, record the following in your food log as soon as possible: What you ate. Be sure to include toppings, sauces, and other extras on the food. How much you ate. This can be measured in cups, ounces, or number of items. How many calories were in each food and drink. The total number of calories in the food you ate. Keep your food log near you, such as in a pocket-sized notebook or on an app or website on your mobile phone. Some programs will calculate calories for you andshow you how many calories you have left to meet your daily goal. What are some portion-control tips? Know how many calories are in a serving. This will help you know how many servings you can have of a certain food. Use a measuring cup to   measure serving sizes. You could also try weighing out portions on a kitchen scale. With time, you will be able to estimate serving sizes for some foods. Take time to put servings of different foods on your favorite plates or in your favorite bowls and cups so you know what a serving looks like. Try not to eat straight from a food's packaging, such as from a bag or box. Eating straight from the package makes it hard to see how much you are eating and can lead to overeating. Put the amount you would like to eat in a cup or on a plate to make sure you are eating the right portion. Use smaller plates, glasses, and bowls for smaller portions and to prevent  overeating. Try not to multitask. For example, avoid watching TV or using your computer while eating. If it is time to eat, sit down at a table and enjoy your food. This will help you recognize when you are full. It will also help you be more mindful of what and how much you are eating. What are tips for following this plan? Reading food labels Check the calorie count compared with the serving size. The serving size may be smaller than what you are used to eating. Check the source of the calories. Try to choose foods that are high in protein, fiber, and vitamins, and low in saturated fat, trans fat, and sodium. Shopping Read nutrition labels while you shop. This will help you make healthy decisions about which foods to buy. Pay attention to nutrition labels for low-fat or fat-free foods. These foods sometimes have the same number of calories or more calories than the full-fat versions. They also often have added sugar, starch, or salt to make up for flavor that was removed with the fat. Make a grocery list of lower-calorie foods and stick to it. Cooking Try to cook your favorite foods in a healthier way. For example, try baking instead of frying. Use low-fat dairy products. Meal planning Use more fruits and vegetables. One-half of your plate should be fruits and vegetables. Include lean proteins, such as chicken, turkey, and fish. Lifestyle Each week, aim to do one of the following: 150 minutes of moderate exercise, such as walking. 75 minutes of vigorous exercise, such as running. General information Know how many calories are in the foods you eat most often. This will help you calculate calorie counts faster. Find a way of tracking calories that works for you. Get creative. Try different apps or programs if writing down calories does not work for you. What foods should I eat?  Eat nutritious foods. It is better to have a nutritious, high-calorie food, such as an avocado, than a food with  few nutrients, such as a bag of potato chips. Use your calories on foods and drinks that will fill you up and will not leave you hungry soon after eating. Examples of foods that fill you up are nuts and nut butters, vegetables, lean proteins, and high-fiber foods such as whole grains. High-fiber foods are foods with more than 5 g of fiber per serving. Pay attention to calories in drinks. Low-calorie drinks include water and unsweetened drinks. The items listed above may not be a complete list of foods and beverages you can eat. Contact a dietitian for more information. What foods should I limit? Limit foods or drinks that are not good sources of vitamins, minerals, or protein or that are high in unhealthy fats. These include: Candy. Other sweets. Sodas, specialty   coffee drinks, alcohol, and juice. The items listed above may not be a complete list of foods and beverages you should avoid. Contact a dietitian for more information. How do I count calories when eating out? Pay attention to portions. Often, portions are much larger when eating out. Try these tips to keep portions smaller: Consider sharing a meal instead of getting your own. If you get your own meal, eat only half of it. Before you start eating, ask for a container and put half of your meal into it. When available, consider ordering smaller portions from the menu instead of full portions. Pay attention to your food and drink choices. Knowing the way food is cooked and what is included with the meal can help you eat fewer calories. If calories are listed on the menu, choose the lower-calorie options. Choose dishes that include vegetables, fruits, whole grains, low-fat dairy products, and lean proteins. Choose items that are boiled, broiled, grilled, or steamed. Avoid items that are buttered, battered, fried, or served with cream sauce. Items labeled as crispy are usually fried, unless stated otherwise. Choose water, low-fat milk,  unsweetened iced tea, or other drinks without added sugar. If you want an alcoholic beverage, choose a lower-calorie option, such as a glass of wine or light beer. Ask for dressings, sauces, and syrups on the side. These are usually high in calories, so you should limit the amount you eat. If you want a salad, choose a garden salad and ask for grilled meats. Avoid extra toppings such as bacon, cheese, or fried items. Ask for the dressing on the side, or ask for olive oil and vinegar or lemon to use as dressing. Estimate how many servings of a food you are given. Knowing serving sizes will help you be aware of how much food you are eating at restaurants. Where to find more information Centers for Disease Control and Prevention: www.cdc.gov [place] Department of Agriculture: myplate.gov Summary Calorie counting means keeping track of how many calories you eat and drink each day. If you eat fewer calories than your body needs, you should lose weight. A healthy amount of weight to lose per week is usually 1-2 lb (0.5-0.9 kg). This usually means reducing your daily calorie intake by 500-750 calories. The number of calories in a food can be found on a Nutrition Facts label. If a food does not have a Nutrition Facts label, try to look up the calories online or ask your dietitian for help. Use smaller plates, glasses, and bowls for smaller portions and to prevent overeating. Use your calories on foods and drinks that will fill you up and not leave you hungry shortly after a meal. This information is not intended to replace advice given to you by your health care provider. Make sure you discuss any questions you have with your healthcare provider. Document Revised: 11/05/2019 Document Reviewed: 11/05/2019 Elsevier Patient Education  2022 [employer] Inc.  Fat and Cholesterol Restricted Eating Plan Getting too much fat and cholesterol in your diet may cause health problems. Choosing the right foods helps keep  your fat and cholesterol at normal levels.This can keep you from getting certain diseases. Your doctor may recommend an eating plan that includes: Total fat: ______% or less of total calories a day. Saturated fat: ______% or less of total calories a day. Cholesterol: less than _________mg a day. Fiber: ______g a day. What are tips for following this plan? Meal planning At meals, divide your plate into four equal parts: Fill one-half of   your plate with vegetables and green salads. Fill one-fourth of your plate with whole grains. Fill one-fourth of your plate with low-fat (lean) protein foods. Eat fish that is high in omega-3 fats at least two times a week. This includes mackerel, tuna, sardines, and salmon. Eat foods that are high in fiber, such as whole grains, beans, apples, broccoli, carrots, peas, and barley. General tips  Work with your doctor to lose weight if you need to. Avoid: Foods with added sugar. Fried foods. Foods with partially hydrogenated oils. Limit alcohol intake to no more than 1 drink a day for nonpregnant women and 2 drinks a day for men. One drink equals 12 oz of beer, 5 oz of wine, or 1 oz of hard liquor.  Reading food labels Check food labels for: Trans fats. Partially hydrogenated oils. Saturated fat (g) in each serving. Cholesterol (mg) in each serving. Fiber (g) in each serving. Choose foods with healthy fats, such as: Monounsaturated fats. Polyunsaturated fats. Omega-3 fats. Choose grain products that have whole grains. Look for the word "whole" as the first word in the ingredient list. Cooking Cook foods using low-fat methods. These include baking, boiling, grilling, and broiling. Eat more home-cooked foods. Eat at restaurants and buffets less often. Avoid cooking using saturated fats, such as butter, cream, palm oil, palm kernel oil, and coconut oil. Recommended foods  Fruits All fresh, canned (in natural juice), or frozen  fruits. Vegetables Fresh or frozen vegetables (raw, steamed, roasted, or grilled). Green salads. Grains Whole grains, such as whole wheat or whole grain breads, crackers, cereals, and pasta. Unsweetened oatmeal, bulgur, barley, quinoa, or brown rice. Corn or whole wheat flour tortillas. Meats and other protein foods Ground beef (85% or leaner), grass-fed beef, or beef trimmed of fat. Skinless chicken or turkey. Ground chicken or turkey. Pork trimmed of fat. All fish and seafood. Egg whites. Dried beans, peas, or lentils. Unsalted nuts or seeds. Unsalted canned beans. Nut butters without added sugar or oil. Dairy Low-fat or nonfat dairy products, such as skim or 1% milk, 2% or reduced-fat cheeses, low-fat and fat-free ricotta or cottage cheese, or plain low-fat and nonfat yogurt. Fats and oils Tub margarine without trans fats. Light or reduced-fat mayonnaise and salad dressings. Avocado. Olive, canola, sesame, or safflower oils. The items listed above may not be a complete list of foods and beverages youcan eat. Contact a dietitian for more information. Foods to avoid Fruits Canned fruit in heavy syrup. Fruit in cream or butter sauce. Fried fruit. Vegetables Vegetables cooked in cheese, cream, or butter sauce. Fried vegetables. Grains White bread. White pasta. White rice. Cornbread. Bagels, pastries, and croissants. Crackers and snack foods that contain trans fat and hydrogenated oils. Meats and other protein foods Fatty cuts of meat. Ribs, chicken wings, bacon, sausage, bologna, salami, chitterlings, fatback, hot dogs, bratwurst, and packaged lunch meats. Liver and organ meats. Whole eggs and egg yolks. Chicken and turkey with skin. Fried meat. Dairy Whole or 2% milk, cream, half-and-half, and cream cheese. Whole milk cheeses. Whole-fat or sweetened yogurt. Full-fat cheeses. Nondairy creamers and whipped toppings. Processed cheese, cheese spreads, and cheese curds. Beverages Alcohol.  Sugar-sweetened drinks such as sodas, lemonade, and fruit drinks. Fats and oils Butter, stick margarine, lard, shortening, ghee, or bacon fat. Coconut, palm kernel, and palm oils. Sweets and desserts Corn syrup, sugars, honey, and molasses. Candy. Jam and jelly. Syrup. Sweetened cereals. Cookies, pies, cakes, donuts, muffins, and ice cream. The items listed above may not be a complete list   of foods and beverages youshould avoid. Contact a dietitian for more information. Summary Choosing the right foods helps keep your fat and cholesterol at normal levels. This can keep you from getting certain diseases. At meals, fill one-half of your plate with vegetables and green salads. Eat high-fiber foods, like whole grains, beans, apples, carrots, peas, and barley. Limit added sugar, saturated fats, alcohol, and fried foods. This information is not intended to replace advice given to you by your health care provider. Make sure you discuss any questions you have with your healthcare provider. Document Revised: 01/27/2020 Document Reviewed: 01/27/2020 Elsevier Patient Education  2022 Elsevier Inc.  

## 2021-04-19 NOTE — Progress Notes (Signed)
Established Patient Office Visit  Subjective:  Patient ID: Kaylee Rodgers, female    DOB: [birth year]  Age: 35 y.o. MRN: [id number]  CC:  Chief Complaint  Patient presents with   Weight Check    HPI Kaylee Rodgers presents for evaluation of weight management.  Currently taking phentermine 37.5 mg tablets daily.  She has had a 1 pound weight loss since her last visit.  She is lost 15 pounds since starting on phentermine in April 2022.  She states that she has had some trouble with snacking in the last month.  In early July, her father was diagnosed with throat cancer and her mother was diagnosed with multiple DVTs in the right leg.  Patient states she has been using ice cream as a "vice".  She states she needs to change this.  She has started exercising.  She and her aunt have started swimming 3 to 4 days/week.  The patient has a goal of a 6 pound weight loss over the next 30 days.  She has no negative side effects related to taking phentermine.  Past Medical History:  Diagnosis Date   Allergy     Past Surgical History:  Procedure Laterality Date   TONSILLECTOMY      Family History  Problem Relation Age of Onset   Arthritis/Rheumatoid Mother    Diabetes Mother    Hypertension Father    Alcoholism Father    High blood pressure Father    Diabetes Maternal Grandmother    Cancer Paternal Grandmother    High Cholesterol Paternal Grandmother    High blood pressure Paternal Grandfather     Social History   Socioeconomic History   Marital status: Married    Spouse name: Not on file   Number of children: Not on file   Years of education: Not on file   Highest education level: Not on file  Occupational History   Not on file  Tobacco Use   Smoking status: Former    Pack years: 0.00    Types: Cigarettes    Quit date: 11/12/2005    Years since quitting: 15.4   Smokeless tobacco: Never  Vaping Use   Vaping Use: Never used  Substance and Sexual Activity   Alcohol  use: Yes    Comment: socially   Drug use: Never   Sexual activity: Yes    Partners: Female  Other Topics Concern   Not on file  Social History Narrative   Not on file   Social Determinants of Health   Financial Resource Strain: Not on file  Food Insecurity: Not on file  Transportation Needs: Not on file  Physical Activity: Not on file  Stress: Not on file  Social Connections: Not on file  Intimate Partner Violence: Not on file    Outpatient Medications Prior to Visit  Medication Sig Dispense Refill   Cholecalciferol (VITAMIN D3) 1000 units CAPS Take by mouth.     hydrOXYzine (ATARAX/VISTARIL) 25 MG tablet Take 1/2 to 1 tablet po QD prn acute anxiety 30 tablet 1   mupirocin ointment (BACTROBAN) 2 % Apply 1 application topically 2 (two) times daily. 22 g 0   Ubrogepant (UBRELVY) 100 MG TABS Take 100 mg by mouth daily as needed. 10 tablet 3   phentermine (ADIPEX-P) 37.5 MG tablet Take 1 tablet (37.5 mg total) by mouth daily before breakfast. 30 tablet 1   Multiple Vitamins-Minerals (EMERGEN-C IMMUNE PLUS/VIT D) CHEW Chew 1 tablet by mouth daily. (Patient not taking: Reported  on 04/19/2021)     No facility-administered medications prior to visit.    No Known Allergies  ROS Review of Systems  Constitutional:  Negative for activity change, chills and fever.       1 pound weight loss since most recent visit in June.  10 pound weight loss since restarting phentermine in April 2022.  HENT:  Negative for congestion, postnasal drip, rhinorrhea, sinus pressure and sinus pain.   Eyes: Negative.   Respiratory:  Negative for cough, chest tightness, shortness of breath and wheezing.   Cardiovascular:  Negative for chest pain and palpitations.  Gastrointestinal:  Negative for constipation, diarrhea, nausea and vomiting.  Endocrine: Negative for cold intolerance, heat intolerance, polydipsia and polyuria.  Genitourinary: Negative.   Musculoskeletal: Negative.  Negative for back pain and  myalgias.  Skin:  Negative for rash.  Allergic/Immunologic: Negative.   Neurological: Negative.  Negative for dizziness, weakness and headaches.  Psychiatric/Behavioral:  The patient is not nervous/anxious.      Objective:    Physical Exam Vitals and nursing note reviewed.  Constitutional:      Appearance: Normal appearance. She is well-developed. She is obese.  HENT:     Head: Normocephalic and atraumatic.     Nose: Nose normal.     Mouth/Throat:     Mouth: Mucous membranes are moist.  Eyes:     Extraocular Movements: Extraocular movements intact.     Conjunctiva/sclera: Conjunctivae normal.     Pupils: Pupils are equal, round, and reactive to light.  Cardiovascular:     Rate and Rhythm: Normal rate and regular rhythm.     Pulses: Normal pulses.     Heart sounds: Normal heart sounds.  Pulmonary:     Effort: Pulmonary effort is normal.     Breath sounds: Normal breath sounds.  Abdominal:     Palpations: Abdomen is soft.  Musculoskeletal:        General: Normal range of motion.     Cervical back: Normal range of motion and neck supple.  Lymphadenopathy:     Cervical: No cervical adenopathy.  Skin:    General: Skin is warm and dry.     Capillary Refill: Capillary refill takes less than 2 seconds.  Neurological:     General: No focal deficit present.     Mental Status: She is alert and oriented to person, place, and time.  Psychiatric:        Mood and Affect: Mood normal.        Behavior: Behavior normal.        Thought Content: Thought content normal.        Judgment: Judgment normal.    Today's Vitals   04/19/21 1039  BP: 125/84  Pulse: 72  Temp: 99.4 F (37.4 C)  SpO2: 99%  Weight: 261 lb 3.2 oz (118.5 kg)  Height: 6' (1.829 m)   Body mass index is 35.43 kg/m.  Wt Readings from Last 3 Encounters:  04/19/21 261 lb 3.2 oz (118.5 kg)  03/16/21 262 lb 4.8 oz (119 kg)  02/16/21 268 lb 12.8 oz (121.9 kg)     Health Maintenance Due  Topic Date Due   HIV  Screening  Never done   TETANUS/TDAP  Never done    There are no preventive care reminders to display for this patient.  Lab Results  Component Value Date   TSH 3.010 01/20/2021   Lab Results  Component Value Date   WBC 7.1 01/20/2021   HGB 13.4 01/20/2021  HCT 39.6 01/20/2021   MCV 92 01/20/2021   PLT 349 01/20/2021   Lab Results  Component Value Date   NA 138 01/20/2021   K 4.4 01/20/2021   CO2 19 (L) 01/20/2021   GLUCOSE 88 01/20/2021   BUN 9 01/20/2021   CREATININE 0.83 01/20/2021   BILITOT 0.9 01/20/2021   ALKPHOS 84 01/20/2021   AST 14 01/20/2021   ALT 15 01/20/2021   PROT 6.6 01/20/2021   ALBUMIN 4.6 01/20/2021   CALCIUM 9.0 01/20/2021   ANIONGAP 6 (L) 09/20/2014   EGFR 94 01/20/2021   Lab Results  Component Value Date   CHOL 148 01/20/2021   Lab Results  Component Value Date   HDL 54 01/20/2021   Lab Results  Component Value Date   LDLCALC 80 01/20/2021   Lab Results  Component Value Date   TRIG 69 01/20/2021   Lab Results  Component Value Date   CHOLHDL 2.7 01/20/2021   No results found for: HGBA1C    Assessment & Plan:  1. BMI 35.0-35.9,adult Patient with 1 pound weight loss since most recent visit.  Recognizes she is using ice cream "vice" to deal with stress.  Plans to change this over the next 30 days.  Has a goal to lose 6 pounds in the next month.  May continue phentermine 37.5 mg tablets daily.  Recommend a 1200 to 1500-calorie diet.  Swimming other low impact exercise 3 to 4 days/week.  Reassess next month. - phentermine (ADIPEX-P) 37.5 MG tablet; Take 1 tablet (37.5 mg total) by mouth daily before breakfast.  Dispense: 30 tablet; Refill: 0   Problem List Items Addressed This Visit       Other   BMI 35.0-35.9,adult - Primary   Relevant Medications   phentermine (ADIPEX-P) 37.5 MG tablet    Meds ordered this encounter  Medications   phentermine (ADIPEX-P) 37.5 MG tablet    Sig: Take 1 tablet (37.5 mg total) by mouth daily  before breakfast.    Dispense:  30 tablet    Refill:  0    Order Specific Question:   Supervising Provider    Answer:   Beatrice Lecher D [[id number]    Follow-up: Return in about 4 weeks (around 05/17/2021) for routine - weight management.    Ronnell Freshwater, NP

## 2021-05-17 ENCOUNTER — Ambulatory Visit: Payer: Managed Care, Other (non HMO) | Admitting: Nurse Practitioner

## 2021-06-19 ENCOUNTER — Encounter: Payer: Managed Care, Other (non HMO) | Admitting: Physician Assistant

## 2021-06-22 ENCOUNTER — Ambulatory Visit: Payer: Managed Care, Other (non HMO) | Admitting: Nurse Practitioner

## 2021-06-22 ENCOUNTER — Encounter: Payer: Self-pay | Admitting: Nurse Practitioner

## 2021-06-22 ENCOUNTER — Other Ambulatory Visit: Payer: Self-pay

## 2021-06-22 VITALS — BP 123/79 | HR 76 | Temp 98.0°F | Ht 72.0 in | Wt 260.8 lb

## 2021-06-22 DIAGNOSIS — Z6835 Body mass index (BMI) 35.0-35.9, adult: Secondary | ICD-10-CM

## 2021-06-22 DIAGNOSIS — M542 Cervicalgia: Secondary | ICD-10-CM | POA: Diagnosis not present

## 2021-06-22 MED ORDER — PHENTERMINE HCL 37.5 MG PO TABS: 37.5000 mg | ORAL_TABLET | Freq: Every day | ORAL | 0 refills | Status: DC

## 2021-06-22 NOTE — Patient Instructions (Signed)

## 2021-06-22 NOTE — Progress Notes (Signed)
Established Patient Office Visit  Subjective:  Patient ID: Kaylee Rodgers, female    DOB: 07/04/1986  Age: 35 y.o. MRN: [id number]  CC:  Chief Complaint  Patient presents with   Follow-up   Weight Check    HPI Kaylee Rodgers presents for Follow-up of weight management program.  She has been taking phentermine since April, 2022.  She has lost 1 pound since her last visit.  She states she really got out of her routine this month.  After her recent visit, she did contract COVID.  Upon recovery her cousin passed away suddenly.  Following that she did have a family vacation.  The patient states she is trying to get back into some sort of routine.  States it would be beneficial for her to find her change to her home.  She does decline 1 where she will build a lot of muscle mass as this will cause weight gain.  She states she has started to get up early in the mornings.  She is walking every day again.  She has been going to water aerobics with her aunt.  She has lost 1 pound since her most recent visit.  Her goal is to lose 6 pounds between now and her next visit in 30 days.  She denies negative side effects from taking phentermine.  She denies chest pain, chest pressure, palpitations, or headaches.  She states taking phentermine does help curb her cravings for sweet foods especially during times of stress. The patient states she has been having some neck and shoulder pain.  This started after her cousin passed away and she is been spending a lot of time cleaning her cousin's house.  She is taking a previously prescribed NSAID 1-2 times daily.  She does have muscle relaxer, also previously prescribed which does help. She has no new concerns or complaints today.  She would like to get a flu shot she will make appointment for next week to come back for flu shot.  Past Medical History:  Diagnosis Date   Allergy     Past Surgical History:  Procedure Laterality Date   TONSILLECTOMY       Family History  Problem Relation Age of Onset   Arthritis/Rheumatoid Mother    Diabetes Mother    Hypertension Father    Alcoholism Father    High blood pressure Father    Diabetes Maternal Grandmother    Cancer Paternal Grandmother    High Cholesterol Paternal Grandmother    High blood pressure Paternal Grandfather     Social History   Socioeconomic History   Marital status: Married    Spouse name: Not on file   Number of children: Not on file   Years of education: Not on file   Highest education level: Not on file  Occupational History   Not on file  Tobacco Use   Smoking status: Former    Types: Cigarettes    Quit date: 11/12/2005    Years since quitting: 15.6   Smokeless tobacco: Never  Vaping Use   Vaping Use: Never used  Substance and Sexual Activity   Alcohol use: Yes    Comment: socially   Drug use: Never   Sexual activity: Yes    Partners: Female  Other Topics Concern   Not on file  Social History Narrative   Not on file   Social Determinants of Health   Financial Resource Strain: Not on file  Food Insecurity: Not on file  Transportation  Needs: Not on file  Physical Activity: Not on file  Stress: Not on file  Social Connections: Not on file  Intimate Partner Violence: Not on file    Outpatient Medications Prior to Visit  Medication Sig Dispense Refill   Cholecalciferol (VITAMIN D3) 1000 units CAPS Take by mouth.     hydrOXYzine (ATARAX/VISTARIL) 25 MG tablet Take 1/2 to 1 tablet po QD prn acute anxiety 30 tablet 1   mupirocin ointment (BACTROBAN) 2 % Apply 1 application topically 2 (two) times daily. 22 g 0   Ubrogepant (UBRELVY) 100 MG TABS Take 100 mg by mouth daily as needed. 10 tablet 3   phentermine (ADIPEX-P) 37.5 MG tablet Take 1 tablet (37.5 mg total) by mouth daily before breakfast. 30 tablet 0   Multiple Vitamins-Minerals (EMERGEN-C IMMUNE PLUS/VIT D) CHEW Chew 1 tablet by mouth daily. (Patient not taking: No sig reported)     No  facility-administered medications prior to visit.    No Known Allergies  ROS Review of Systems  Constitutional:  Positive for fatigue. Negative for activity change, appetite change, chills and fever.       Patient has had a 1 pound weight loss since her most recent visit.  HENT:  Negative for congestion, postnasal drip, rhinorrhea, sinus pressure, sinus pain, sneezing and sore throat.   Eyes: Negative.   Respiratory:  Negative for cough, chest tightness, shortness of breath and wheezing.   Cardiovascular:  Negative for chest pain and palpitations.  Gastrointestinal:  Negative for abdominal pain, constipation, diarrhea, nausea and vomiting.  Endocrine: Negative for cold intolerance, heat intolerance, polydipsia and polyuria.  Genitourinary:  Negative for dyspareunia, dysuria, flank pain, frequency and urgency.  Musculoskeletal:  Positive for arthralgias, back pain, myalgias and neck pain.  Skin:  Negative for rash.  Allergic/Immunologic: Negative for environmental allergies.  Neurological:  Negative for dizziness, weakness and headaches.  Hematological:  Negative for adenopathy.  Psychiatric/Behavioral:  The patient is nervous/anxious.      Objective:    Physical Exam Vitals and nursing note reviewed.  Constitutional:      Appearance: Normal appearance. She is well-developed.  HENT:     Head: Normocephalic and atraumatic.     Nose: Nose normal.     Mouth/Throat:     Mouth: Mucous membranes are moist.  Eyes:     Extraocular Movements: Extraocular movements intact.     Conjunctiva/sclera: Conjunctivae normal.     Pupils: Pupils are equal, round, and reactive to light.  Cardiovascular:     Rate and Rhythm: Normal rate and regular rhythm.     Pulses: Normal pulses.     Heart sounds: Normal heart sounds.  Pulmonary:     Effort: Pulmonary effort is normal.     Breath sounds: Normal breath sounds.  Abdominal:     Palpations: Abdomen is soft.  Musculoskeletal:        General:  Normal range of motion.     Cervical back: Normal range of motion and neck supple.  Lymphadenopathy:     Cervical: No cervical adenopathy.  Skin:    General: Skin is warm and dry.     Capillary Refill: Capillary refill takes less than 2 seconds.  Neurological:     General: No focal deficit present.     Mental Status: She is alert and oriented to person, place, and time.  Psychiatric:        Mood and Affect: Mood normal.        Behavior: Behavior normal.  Thought Content: Thought content normal.        Judgment: Judgment normal.    Today's Vitals   06/22/21 0840  BP: 123/79  Pulse: 76  Temp: 98 F (36.7 C)  SpO2: 100%  Weight: 260 lb 12.8 oz (118.3 kg)  Height: 6' (1.829 m)   Body mass index is 35.37 kg/m.   Wt Readings from Last 3 Encounters:  06/22/21 260 lb 12.8 oz (118.3 kg)  04/19/21 261 lb 3.2 oz (118.5 kg)  03/16/21 262 lb 4.8 oz (119 kg)     Health Maintenance Due  Topic Date Due   HIV Screening  Never done   TETANUS/TDAP  Never done   INFLUENZA VACCINE  05/08/2021    There are no preventive care reminders to display for this patient.  Lab Results  Component Value Date   TSH 3.010 01/20/2021   Lab Results  Component Value Date   WBC 7.1 01/20/2021   HGB 13.4 01/20/2021   HCT 39.6 01/20/2021   MCV 92 01/20/2021   PLT 349 01/20/2021   Lab Results  Component Value Date   NA 138 01/20/2021   K 4.4 01/20/2021   CO2 19 (L) 01/20/2021   GLUCOSE 88 01/20/2021   BUN 9 01/20/2021   CREATININE 0.83 01/20/2021   BILITOT 0.9 01/20/2021   ALKPHOS 84 01/20/2021   AST 14 01/20/2021   ALT 15 01/20/2021   PROT 6.6 01/20/2021   ALBUMIN 4.6 01/20/2021   CALCIUM 9.0 01/20/2021   ANIONGAP 6 (L) 09/20/2014   EGFR 94 01/20/2021   Lab Results  Component Value Date   CHOL 148 01/20/2021   Lab Results  Component Value Date   HDL 54 01/20/2021   Lab Results  Component Value Date   LDLCALC 80 01/20/2021   Lab Results  Component Value Date    TRIG 69 01/20/2021   Lab Results  Component Value Date   CHOLHDL 2.7 01/20/2021   No results found for: HGBA1C    Assessment & Plan:  1. BMI 35.0-35.9,adult Despite some recent obstacles this month, patient has lost 1 pound since her most recent visit.  We will continue 27.5 mg tablets daily.  Recommending a 1500-calorie diet which is low in fat and low cholesterol.  Encouraged her to incorporate exercise into her daily routine.  She has a goal of 6 pound weight loss prior to next visit. - phentermine (ADIPEX-P) 37.5 MG tablet; Take 1 tablet (37.5 mg total) by mouth daily before breakfast.  Dispense: 30 tablet; Refill: 0  2. Neck pain, musculoskeletal Patient taking previously prescribed NSAIDs and muscle relaxer as needed and as prescribed for neck pain related to stress and tension and exertion.  Currently does not need new prescriptions.  Will monitor.  Problem List Items Addressed This Visit       Other   Neck pain, musculoskeletal   BMI 35.0-35.9,adult - Primary   Relevant Medications   phentermine (ADIPEX-P) 37.5 MG tablet    Meds ordered this encounter  Medications   phentermine (ADIPEX-P) 37.5 MG tablet    Sig: Take 1 tablet (37.5 mg total) by mouth daily before breakfast.    Dispense:  30 tablet    Refill:  0    Order Specific Question:   Supervising Provider    Answer:   Beatrice Lecher D [[id number]]   This note was dictated using Dragon Voice Recognition Software. Rapid proofreading was performed to expedite the delivery of the information. Despite proofreading, phonetic errors will occur which  are common with this voice recognition software. Please take this into consideration. If there are any concerns, please contact our office.    Follow-up: Return in about 4 weeks (around 07/20/2021) for health maintenance exam and weight management .    Ronnell Freshwater, NP

## 2021-07-19 ENCOUNTER — Encounter: Payer: Managed Care, Other (non HMO) | Admitting: Nurse Practitioner

## 2021-08-01 ENCOUNTER — Other Ambulatory Visit: Payer: Self-pay

## 2021-08-01 ENCOUNTER — Ambulatory Visit (INDEPENDENT_AMBULATORY_CARE_PROVIDER_SITE_OTHER): Payer: Managed Care, Other (non HMO) | Admitting: Nurse Practitioner

## 2021-08-01 ENCOUNTER — Encounter: Payer: Self-pay | Admitting: Nurse Practitioner

## 2021-08-01 VITALS — BP 120/81 | HR 76 | Temp 98.1°F | Ht 72.0 in | Wt 261.0 lb

## 2021-08-01 DIAGNOSIS — G43009 Migraine without aura, not intractable, without status migrainosus: Secondary | ICD-10-CM | POA: Diagnosis not present

## 2021-08-01 DIAGNOSIS — Z0001 Encounter for general adult medical examination with abnormal findings: Secondary | ICD-10-CM | POA: Diagnosis not present

## 2021-08-01 DIAGNOSIS — Z6835 Body mass index (BMI) 35.0-35.9, adult: Secondary | ICD-10-CM

## 2021-08-01 DIAGNOSIS — R350 Frequency of micturition: Secondary | ICD-10-CM

## 2021-08-01 DIAGNOSIS — Z23 Encounter for immunization: Secondary | ICD-10-CM | POA: Diagnosis not present

## 2021-08-01 LAB — POCT URINALYSIS DIPSTICK
Bilirubin, UA: NEGATIVE
Glucose, UA: NEGATIVE
Ketones, UA: NEGATIVE
Leukocytes, UA: NEGATIVE
Nitrite, UA: NEGATIVE
Protein, UA: NEGATIVE
Spec Grav, UA: 1.01 (ref 1.010–1.025)
Urobilinogen, UA: 0.2 E.U./dL
pH, UA: 7.5 (ref 5.0–8.0)

## 2021-08-01 MED ORDER — UBRELVY 100 MG PO TABS: 100.0000 mg | ORAL_TABLET | Freq: Every day | ORAL | 3 refills | Status: DC | PRN

## 2021-08-01 NOTE — Patient Instructions (Addendum)
Preventive Care 21-37 Years Old, Female Preventive care refers to lifestyle choices and visits with your health care provider that can promote health and wellness. This includes: A yearly physical exam. This is also called an annual wellness visit. Regular dental and eye exams. Immunizations. Screening for certain conditions. Healthy lifestyle choices, such as: Eating a healthy diet. Getting regular exercise. Not using drugs or products that contain nicotine and tobacco. Limiting alcohol use. What can I expect for my preventive care visit? Physical exam Your health care provider may check your: Height and weight. These may be used to calculate your BMI (body mass index). BMI is a measurement that tells if you are at a healthy weight. Heart rate and blood pressure. Body temperature. Skin for abnormal spots. Counseling Your health care provider may ask you questions about your: Past medical problems. Family's medical history. Alcohol, tobacco, and drug use. Emotional well-being. Home life and relationship well-being. Sexual activity. Diet, exercise, and sleep habits. Work and work environment. Access to firearms. Method of birth control. Menstrual cycle. Pregnancy history. What immunizations do I need? Vaccines are usually given at various ages, according to a schedule. Your health care provider will recommend vaccines for you based on your age, medical history, and lifestyle or other factors, such as travel or where you work. What tests do I need? Blood tests Lipid and cholesterol levels. These may be checked every 5 years starting at age 20. Hepatitis C test. Hepatitis B test. Screening Diabetes screening. This is done by checking your blood sugar (glucose) after you have not eaten for a while (fasting). STD (sexually transmitted disease) testing, if you are at risk. BRCA-related cancer screening. This may be done if you have a family history of breast, ovarian, tubal, or  peritoneal cancers. Pelvic exam and Pap test. This may be done every 3 years starting at age 21. Starting at age 30, this may be done every 5 years if you have a Pap test in combination with an HPV test. Talk with your health care provider about your test results, treatment options, and if necessary, the need for more tests. Follow these instructions at home: Eating and drinking  Eat a healthy diet that includes fresh fruits and vegetables, whole grains, lean protein, and low-fat dairy products. Take vitamin and mineral supplements as recommended by your health care provider. Do not drink alcohol if: Your health care provider tells you not to drink. You are pregnant, may be pregnant, or are planning to become pregnant. If you drink alcohol: Limit how much you have to 0-1 drink a day. Be aware of how much alcohol is in your drink. In the [place] one drink equals one 12 oz bottle of beer (355 mL), one 5 oz glass of wine (148 mL), or one 1 oz glass of hard liquor (44 mL). Lifestyle Take daily care of your teeth and gums. Brush your teeth every morning and night with fluoride toothpaste. Floss one time each day. Stay active. Exercise for at least 30 minutes 5 or more days each week. Do not use any products that contain nicotine or tobacco, such as cigarettes, e-cigarettes, and chewing tobacco. If you need help quitting, ask your health care provider. Do not use drugs. If you are sexually active, practice safe sex. Use a condom or other form of protection to prevent STIs (sexually transmitted infections). If you do not wish to become pregnant, use a form of birth control. If you plan to become pregnant, see your health care provider   for a prepregnancy visit. Find healthy ways to cope with stress, such as: Meditation, yoga, or listening to music. Journaling. Talking to a trusted person. Spending time with friends and family. Safety Always wear your seat belt while driving or riding in a  vehicle. Do not drive: If you have been drinking alcohol. Do not ride with someone who has been drinking. When you are tired or distracted. While texting. Wear a helmet and other protective equipment during sports activities. If you have firearms in your house, make sure you follow all gun safety procedures. Seek help if you have been physically or sexually abused. What's next? Go to your health care provider once a year for an annual wellness visit. Ask your health care provider how often you should have your eyes and teeth checked. Stay up to date on all vaccines. This information is not intended to replace advice given to you by your health care provider. Make sure you discuss any questions you have with your health care provider. Document Revised: 12/02/2020 Document Reviewed: 06/05/2018 Elsevier Patient Education  2022 [place] and Cholesterol Restricted Eating Plan Getting too much fat and cholesterol in your diet may cause health problems. Choosing the right foods helps keep your fat and cholesterol at normal levels. This can keep you from getting certain diseases. Your doctor may recommend an eating plan that includes: Total fat: ______% or less of total calories a day. Saturated fat: ______% or less of total calories a day. Cholesterol: less than _________mg a day. Fiber: ______g a day. What are tips for following this plan? Meal planning At meals, divide your plate into four equal parts: Fill one-half of your plate with vegetables and green salads. Fill one-fourth of your plate with whole grains. Fill one-fourth of your plate with low-fat (lean) protein foods. Eat fish that is high in omega-3 fats at least two times a week. This includes mackerel, tuna, sardines, and salmon. Eat foods that are high in fiber, such as whole grains, beans, apples, broccoli, carrots, peas, and barley. General tips  Work with your doctor to lose weight if you need to. Avoid: Foods  with added sugar. Fried foods. Foods with partially hydrogenated oils. Limit alcohol intake to no more than 1 drink a day for nonpregnant women and 2 drinks a day for men. One drink equals 12 oz of beer, 5 oz of wine, or 1 oz of hard liquor. Reading food labels Check food labels for: Trans fats. Partially hydrogenated oils. Saturated fat (g) in each serving. Cholesterol (mg) in each serving. Fiber (g) in each serving. Choose foods with healthy fats, such as: Monounsaturated fats. Polyunsaturated fats. Omega-3 fats. Choose grain products that have whole grains. Look for the word "whole" as the first word in the ingredient list. Cooking Cook foods using low-fat methods. These include baking, boiling, grilling, and broiling. Eat more home-cooked foods. Eat at restaurants and buffets less often. Avoid cooking using saturated fats, such as butter, cream, palm oil, palm kernel oil, and coconut oil. Recommended foods Fruits All fresh, canned (in natural juice), or frozen fruits. Vegetables Fresh or frozen vegetables (raw, steamed, roasted, or grilled). Green salads. Grains Whole grains, such as whole wheat or whole grain breads, crackers, cereals, and pasta. Unsweetened oatmeal, bulgur, barley, quinoa, or brown rice. Corn or whole wheat flour tortillas. Meats and other protein foods Ground beef (85% or leaner), grass-fed beef, or beef trimmed of fat. Skinless chicken or [place]. Ground chicken or [place]. Pork trimmed of fat.  All fish and seafood. Egg whites. Dried beans, peas, or lentils. Unsalted nuts or seeds. Unsalted canned beans. Nut butters without added sugar or oil. Dairy Low-fat or nonfat dairy products, such as skim or 1% milk, 2% or reduced-fat cheeses, low-fat and fat-free ricotta or cottage cheese, or plain low-fat and nonfat yogurt. Fats and oils Tub margarine without trans fats. Light or reduced-fat mayonnaise and salad dressings. Avocado. Olive, canola, sesame, or safflower  oils. The items listed above may not be a complete list of foods and beverages you can eat. Contact a dietitian for more information. Foods to avoid Fruits Canned fruit in heavy syrup. Fruit in cream or butter sauce. Fried fruit. Vegetables Vegetables cooked in cheese, cream, or butter sauce. Fried vegetables. Grains White bread. White pasta. White rice. Cornbread. Bagels, pastries, and croissants. Crackers and snack foods that contain trans fat and hydrogenated oils. Meats and other protein foods Fatty cuts of meat. Ribs, chicken wings, bacon, sausage, bologna, salami, chitterlings, fatback, hot dogs, bratwurst, and packaged lunch meats. Liver and organ meats. Whole eggs and egg yolks. Chicken and [place] with skin. Fried meat. Dairy Whole or 2% milk, cream, half-and-half, and cream cheese. Whole milk cheeses. Whole-fat or sweetened yogurt. Full-fat cheeses. Nondairy creamers and whipped toppings. Processed cheese, cheese spreads, and cheese curds. Beverages Alcohol. Sugar-sweetened drinks such as sodas, lemonade, and fruit drinks. Fats and oils Butter, stick margarine, lard, shortening, ghee, or bacon fat. Coconut, palm kernel, and palm oils. Sweets and desserts Corn syrup, sugars, honey, and molasses. Candy. Jam and jelly. Syrup. Sweetened cereals. Cookies, pies, cakes, donuts, muffins, and ice cream. The items listed above may not be a complete list of foods and beverages you should avoid. Contact a dietitian for more information. Summary Choosing the right foods helps keep your fat and cholesterol at normal levels. This can keep you from getting certain diseases. At meals, fill one-half of your plate with vegetables and green salads. Eat high-fiber foods, like whole grains, beans, apples, carrots, peas, and barley. Limit added sugar, saturated fats, alcohol, and fried foods. This information is not intended to replace advice given to you by your health care provider. Make sure you  discuss any questions you have with your health care provider. Document Revised: 01/27/2020 Document Reviewed: 01/27/2020 Elsevier Patient Education  2022 [employer].

## 2021-08-01 NOTE — Progress Notes (Signed)
Established Patient Office Visit  Subjective:  Patient ID: Kaylee Rodgers, female    DOB: July 18, 1986  Age: 35 y.o. MRN: [id number]  CC:  Chief Complaint  Patient presents with   Annual Exam     HPI Kaylee Rodgers presents for annual wellness visit. She is reporting urinary urgency. She states that tthis has been going on for about a week. She denies burning or pain with urination. She states that she does not usually get dysuria if she has urinary tract infection. She has also been having an extended menstrual cycle. Has lasted fr past two weeks. May be cotributing to urinary urgency.  She has had a one pound weight gain. States that she is not putting in the physical activity to help with weight loss. She has been taking phentermine up until a week ago. We have discussed taking a break from weight loss medication for now until she is ready to add physical activity to daily routine.  She has no new concerns or complaints.  She owuld like to get flu shot while she is here today.   Past Medical History:  Diagnosis Date   Allergy     Past Surgical History:  Procedure Laterality Date   TONSILLECTOMY      Family History  Problem Relation Age of Onset   Arthritis/Rheumatoid Mother    Diabetes Mother    Hypertension Father    Alcoholism Father    High blood pressure Father    Diabetes Maternal Grandmother    Cancer Paternal Grandmother    High Cholesterol Paternal Grandmother    High blood pressure Paternal Grandfather     Social History   Socioeconomic History   Marital status: Married    Spouse name: Not on file   Number of children: Not on file   Years of education: Not on file   Highest education level: Not on file  Occupational History   Not on file  Tobacco Use   Smoking status: Former    Types: Cigarettes    Quit date: 11/12/2005    Years since quitting: 15.7   Smokeless tobacco: Never  Vaping Use   Vaping Use: Never used  Substance and Sexual  Activity   Alcohol use: Yes    Comment: socially   Drug use: Never   Sexual activity: Yes    Partners: Female  Other Topics Concern   Not on file  Social History Narrative   Not on file   Social Determinants of Health   Financial Resource Strain: Not on file  Food Insecurity: Not on file  Transportation Needs: Not on file  Physical Activity: Not on file  Stress: Not on file  Social Connections: Not on file  Intimate Partner Violence: Not on file    Outpatient Medications Prior to Visit  Medication Sig Dispense Refill   Cholecalciferol (VITAMIN D3) 1000 units CAPS Take by mouth.     hydrOXYzine (ATARAX/VISTARIL) 25 MG tablet Take 1/2 to 1 tablet po QD prn acute anxiety 30 tablet 1   mupirocin ointment (BACTROBAN) 2 % Apply 1 application topically 2 (two) times daily. 22 g 0   Multiple Vitamins-Minerals (EMERGEN-C IMMUNE PLUS/VIT D) CHEW Chew 1 tablet by mouth daily. (Patient not taking: No sig reported)     phentermine (ADIPEX-P) 37.5 MG tablet Take 1 tablet (37.5 mg total) by mouth daily before breakfast. 30 tablet 0   Ubrogepant (UBRELVY) 100 MG TABS Take 100 mg by mouth daily as needed. 10 tablet 3  No facility-administered medications prior to visit.    No Known Allergies  ROS Review of Systems  Constitutional:  Negative for activity change, appetite change, chills, fatigue and fever.       One pound weight gain since her lust visit   HENT:  Negative for congestion, postnasal drip, rhinorrhea, sinus pressure, sinus pain, sneezing and sore throat.   Eyes: Negative.   Respiratory:  Negative for cough, chest tightness, shortness of breath and wheezing.   Cardiovascular:  Negative for chest pain and palpitations.  Gastrointestinal:  Negative for abdominal pain, constipation, diarrhea, nausea and vomiting.  Endocrine: Negative for cold intolerance, heat intolerance, polydipsia and polyuria.  Genitourinary:  Positive for frequency and urgency. Negative for dyspareunia,  dysuria and flank pain.  Musculoskeletal:  Negative for arthralgias, back pain and myalgias.  Skin:  Negative for rash.  Allergic/Immunologic: Negative for environmental allergies.  Neurological:  Positive for headaches. Negative for dizziness and weakness.  Hematological:  Negative for adenopathy.  Psychiatric/Behavioral:  The patient is not nervous/anxious.      Objective:    Physical Exam Vitals and nursing note reviewed.  Constitutional:      Appearance: Normal appearance. She is well-developed. She is obese.  HENT:     Head: Normocephalic and atraumatic.     Right Ear: Tympanic membrane, ear canal and external ear normal.     Left Ear: Tympanic membrane, ear canal and external ear normal.     Nose: Nose normal.     Mouth/Throat:     Mouth: Mucous membranes are moist.     Pharynx: Oropharynx is clear.  Eyes:     Extraocular Movements: Extraocular movements intact.     Conjunctiva/sclera: Conjunctivae normal.     Pupils: Pupils are equal, round, and reactive to light.  Cardiovascular:     Rate and Rhythm: Normal rate and regular rhythm.     Pulses: Normal pulses.     Heart sounds: Normal heart sounds.  Pulmonary:     Effort: Pulmonary effort is normal.     Breath sounds: Normal breath sounds.  Chest:  Breasts:    Right: Normal. No swelling, bleeding, inverted nipple, mass, nipple discharge, skin change or tenderness.     Left: Normal. No swelling, bleeding, inverted nipple, mass, nipple discharge, skin change or tenderness.  Abdominal:     General: Bowel sounds are normal.     Palpations: Abdomen is soft.     Tenderness: There is no abdominal tenderness.  Musculoskeletal:        General: Normal range of motion.     Cervical back: Normal range of motion and neck supple.  Lymphadenopathy:     Cervical: No cervical adenopathy.     Upper Body:     Right upper body: No axillary adenopathy.     Left upper body: No axillary adenopathy.  Skin:    General: Skin is warm  and dry.     Capillary Refill: Capillary refill takes less than 2 seconds.  Neurological:     General: No focal deficit present.     Mental Status: She is alert and oriented to person, place, and time.  Psychiatric:        Mood and Affect: Mood normal.        Behavior: Behavior normal.        Thought Content: Thought content normal.        Judgment: Judgment normal.    Today's Vitals   08/01/21 1042  BP: 120/81  Pulse: 76  Temp: 98.1 F (36.7 C)  SpO2: 100%  Weight: 261 lb (118.4 kg)  Height: 6' (1.829 m)   Body mass index is 35.4 kg/m.   Wt Readings from Last 3 Encounters:  08/01/21 261 lb (118.4 kg)  06/22/21 260 lb 12.8 oz (118.3 kg)  04/19/21 261 lb 3.2 oz (118.5 kg)     Health Maintenance Due  Topic Date Due   HIV Screening  Never done   TETANUS/TDAP  Never done   COVID-19 Vaccine (4 - Booster for Pfizer series) 12/02/2020    There are no preventive care reminders to display for this patient.  Lab Results  Component Value Date   TSH 3.010 01/20/2021   Lab Results  Component Value Date   WBC 7.1 01/20/2021   HGB 13.4 01/20/2021   HCT 39.6 01/20/2021   MCV 92 01/20/2021   PLT 349 01/20/2021   Lab Results  Component Value Date   NA 138 01/20/2021   K 4.4 01/20/2021   CO2 19 (L) 01/20/2021   GLUCOSE 88 01/20/2021   BUN 9 01/20/2021   CREATININE 0.83 01/20/2021   BILITOT 0.9 01/20/2021   ALKPHOS 84 01/20/2021   AST 14 01/20/2021   ALT 15 01/20/2021   PROT 6.6 01/20/2021   ALBUMIN 4.6 01/20/2021   CALCIUM 9.0 01/20/2021   ANIONGAP 6 (L) 09/20/2014   EGFR 94 01/20/2021   Lab Results  Component Value Date   CHOL 148 01/20/2021   Lab Results  Component Value Date   HDL 54 01/20/2021   Lab Results  Component Value Date   LDLCALC 80 01/20/2021   Lab Results  Component Value Date   TRIG 69 01/20/2021   Lab Results  Component Value Date   CHOLHDL 2.7 01/20/2021   No results found for: HGBA1C    Assessment & Plan:  1. Encounter for  general adult medical examination with abnormal findings Annual wellness visit today   2. Urine frequency Urine sample showing trace intact blood only. Patient currently having menstrual cycle. No evidence of infection at this time. Advised patient to contact the office if symptoms persist or worsen over next several days.  - POCT urinalysis dipstick  3. Migraine without aura and without status migrainosus, not intractable Renew ubrelvy. Take as needed and as prescribed for acute migraine. - Ubrogepant (UBRELVY) 100 MG TABS; Take 100 mg by mouth daily as needed.  Dispense: 10 tablet; Refill: 3  4. Body mass index (BMI) of 35.0-35.9 in adult Will break fro phentermine for now. Discussed lowering calorie intake to 1500 calories per day and incorporating exercise into daily routine to help lose weight.  5. Need for influenza vaccination Flu vaccine administered today. - Flu Vaccine QUAD 40moIM (Fluarix, Fluzone & Alfiuria Quad PF)   Problem List Items Addressed This Visit       Cardiovascular and Mediastinum   Migraine without aura and without status migrainosus, not intractable   Relevant Medications   Ubrogepant (UBRELVY) 100 MG TABS     Other   Body mass index (BMI) of 35.0-35.9 in adult   Encounter for general adult medical examination with abnormal findings - Primary   Urine frequency   Relevant Orders   POCT urinalysis dipstick (Completed)   Other Visit Diagnoses     Need for influenza vaccination       Relevant Orders   Flu Vaccine QUAD 661moM (Fluarix, Fluzone & Alfiuria Quad PF) (Completed)       Meds ordered this encounter  Medications  Ubrogepant (UBRELVY) 100 MG TABS    Sig: Take 100 mg by mouth daily as needed.    Dispense:  10 tablet    Refill:  3    Order Specific Question:   Supervising Provider    Answer:   Beatrice Lecher D [[id number]]     Follow-up: Return in about 4 months (around 12/02/2021) for migraines.    Ronnell Freshwater, NP

## 2021-08-22 ENCOUNTER — Encounter: Payer: Self-pay | Admitting: Nurse Practitioner

## 2021-08-22 ENCOUNTER — Other Ambulatory Visit: Payer: Self-pay | Admitting: Nurse Practitioner

## 2021-08-22 ENCOUNTER — Other Ambulatory Visit: Payer: Self-pay

## 2021-08-22 ENCOUNTER — Ambulatory Visit: Payer: Managed Care, Other (non HMO) | Admitting: Nurse Practitioner

## 2021-08-22 VITALS — BP 131/86 | HR 79 | Temp 98.3°F | Ht 72.0 in | Wt 264.7 lb

## 2021-08-22 DIAGNOSIS — R051 Acute cough: Secondary | ICD-10-CM | POA: Insufficient documentation

## 2021-08-22 DIAGNOSIS — J0140 Acute pansinusitis, unspecified: Secondary | ICD-10-CM | POA: Insufficient documentation

## 2021-08-22 DIAGNOSIS — Z6835 Body mass index (BMI) 35.0-35.9, adult: Secondary | ICD-10-CM

## 2021-08-22 MED ORDER — HYDROCOD POLST-CPM POLST ER 10-8 MG/5ML PO SUER: 5.0000 mL | Freq: Two times a day (BID) | ORAL | 0 refills | Status: DC | PRN

## 2021-08-22 MED ORDER — AZITHROMYCIN 250 MG PO TABS: ORAL_TABLET | ORAL | 0 refills | Status: DC

## 2021-08-22 NOTE — Progress Notes (Signed)
Acute Office Visit  Subjective:    Patient ID: Kaylee Rodgers, female    DOB: 29-Nov-1985, 35 y.o.   MRN: [id number]  Chief Complaint  Patient presents with   Cough     The patient has been around several family members who have also been sick. All have tested negative for COVID 19. The patient states that she has been taking DayQuil and Nyquil with relief for a short period but symptoms restart after a few hours. She states that she is feeling very fatigued.   Cough This is a new problem. The current episode started 1 to 4 weeks ago. The problem has been gradually worsening. The problem occurs constantly. The cough is Productive of purulent sputum. Associated symptoms include ear congestion, headaches, myalgias, nasal congestion, postnasal drip and rhinorrhea. Pertinent negatives include no chest pain, chills, fever, rash, sore throat, shortness of breath or wheezing. The symptoms are aggravated by dust. She has tried OTC cough suppressant, cool air and body position changes for the symptoms. The treatment provided mild relief. There is no history of environmental allergies.    Past Medical History:  Diagnosis Date   Allergy     Past Surgical History:  Procedure Laterality Date   TONSILLECTOMY      Family History  Problem Relation Age of Onset   Arthritis/Rheumatoid Mother    Diabetes Mother    Hypertension Father    Alcoholism Father    High blood pressure Father    Diabetes Maternal Grandmother    Cancer Paternal Grandmother    High Cholesterol Paternal Grandmother    High blood pressure Paternal Grandfather     Social History   Socioeconomic History   Marital status: Married    Spouse name: Not on file   Number of children: Not on file   Years of education: Not on file   Highest education level: Not on file  Occupational History   Not on file  Tobacco Use   Smoking status: Former    Types: Cigarettes    Quit date: 11/12/2005    Years since quitting:  15.7   Smokeless tobacco: Never  Vaping Use   Vaping Use: Never used  Substance and Sexual Activity   Alcohol use: Yes    Comment: socially   Drug use: Never   Sexual activity: Yes    Partners: Female  Other Topics Concern   Not on file  Social History Narrative   Not on file   Social Determinants of Health   Financial Resource Strain: Not on file  Food Insecurity: Not on file  Transportation Needs: Not on file  Physical Activity: Not on file  Stress: Not on file  Social Connections: Not on file  Intimate Partner Violence: Not on file    Outpatient Medications Prior to Visit  Medication Sig Dispense Refill   Cholecalciferol (VITAMIN D3) 1000 units CAPS Take by mouth.     hydrOXYzine (ATARAX/VISTARIL) 25 MG tablet Take 1/2 to 1 tablet po QD prn acute anxiety 30 tablet 1   mupirocin ointment (BACTROBAN) 2 % Apply 1 application topically 2 (two) times daily. 22 g 0   Ubrogepant (UBRELVY) 100 MG TABS Take 100 mg by mouth daily as needed. 10 tablet 3   No facility-administered medications prior to visit.    No Known Allergies  Review of Systems  Constitutional:  Positive for fatigue. Negative for activity change, appetite change, chills and fever.  HENT:  Positive for congestion, postnasal drip, rhinorrhea, sinus pressure  and sinus pain. Negative for sneezing and sore throat.   Eyes: Negative.   Respiratory:  Positive for cough and chest tightness. Negative for shortness of breath and wheezing.   Cardiovascular:  Negative for chest pain and palpitations.  Gastrointestinal:  Positive for nausea. Negative for abdominal pain, constipation, diarrhea and vomiting.  Endocrine: Negative for cold intolerance, heat intolerance, polydipsia and polyuria.  Genitourinary:  Negative for dyspareunia, dysuria, flank pain, frequency and urgency.  Musculoskeletal:  Positive for myalgias. Negative for arthralgias and back pain.  Skin:  Negative for rash.  Allergic/Immunologic: Negative for  environmental allergies.  Neurological:  Positive for headaches. Negative for dizziness and weakness.  Hematological:  Negative for adenopathy.  Psychiatric/Behavioral:  The patient is not nervous/anxious.       Objective:    Physical Exam Vitals and nursing note reviewed.  Constitutional:      Appearance: Normal appearance. She is well-developed. She is obese. She is ill-appearing.  HENT:     Head: Normocephalic and atraumatic.     Right Ear: Tenderness present. Tympanic membrane is erythematous and bulging.     Left Ear: Tenderness present. Tympanic membrane is erythematous and bulging.     Nose: Congestion and rhinorrhea present.     Right Sinus: Maxillary sinus tenderness and frontal sinus tenderness present.     Left Sinus: Maxillary sinus tenderness and frontal sinus tenderness present.     Mouth/Throat:     Pharynx: Posterior oropharyngeal erythema present.     Comments: Copious post nasal drip evident  Eyes:     Pupils: Pupils are equal, round, and reactive to light.  Cardiovascular:     Rate and Rhythm: Normal rate and regular rhythm.     Pulses: Normal pulses.     Heart sounds: Normal heart sounds.  Pulmonary:     Effort: Pulmonary effort is normal.     Breath sounds: Normal breath sounds.     Comments: Diminished breath sounds throughout the lung fields  Abdominal:     Palpations: Abdomen is soft.  Musculoskeletal:        General: Normal range of motion.     Cervical back: Normal range of motion and neck supple.  Lymphadenopathy:     Cervical: Cervical adenopathy present.  Skin:    General: Skin is warm and dry.     Capillary Refill: Capillary refill takes less than 2 seconds.  Neurological:     General: No focal deficit present.     Mental Status: She is alert and oriented to person, place, and time.  Psychiatric:        Mood and Affect: Mood normal.        Behavior: Behavior normal.        Thought Content: Thought content normal.        Judgment: Judgment  normal.    Today's Vitals   08/22/21 0906  BP: 131/86  Pulse: 79  Temp: 98.3 F (36.8 C)  SpO2: 98%  Weight: 264 lb 11.2 oz (120.1 kg)  Height: 6' (1.829 m)   Body mass index is 35.9 kg/m.   Wt Readings from Last 3 Encounters:  08/22/21 264 lb 11.2 oz (120.1 kg)  08/01/21 261 lb (118.4 kg)  06/22/21 260 lb 12.8 oz (118.3 kg)    Health Maintenance Due  Topic Date Due   HIV Screening  Never done   TETANUS/TDAP  Never done   COVID-19 Vaccine (4 - Booster for Pfizer series) 12/02/2020    There are no preventive  care reminders to display for this patient.   Lab Results  Component Value Date   TSH 3.010 01/20/2021   Lab Results  Component Value Date   WBC 7.1 01/20/2021   HGB 13.4 01/20/2021   HCT 39.6 01/20/2021   MCV 92 01/20/2021   PLT 349 01/20/2021   Lab Results  Component Value Date   NA 138 01/20/2021   K 4.4 01/20/2021   CO2 19 (L) 01/20/2021   GLUCOSE 88 01/20/2021   BUN 9 01/20/2021   CREATININE 0.83 01/20/2021   BILITOT 0.9 01/20/2021   ALKPHOS 84 01/20/2021   AST 14 01/20/2021   ALT 15 01/20/2021   PROT 6.6 01/20/2021   ALBUMIN 4.6 01/20/2021   CALCIUM 9.0 01/20/2021   ANIONGAP 6 (L) 09/20/2014   EGFR 94 01/20/2021   Lab Results  Component Value Date   CHOL 148 01/20/2021   Lab Results  Component Value Date   HDL 54 01/20/2021   Lab Results  Component Value Date   LDLCALC 80 01/20/2021   Lab Results  Component Value Date   TRIG 69 01/20/2021   Lab Results  Component Value Date   CHOLHDL 2.7 01/20/2021   No results found for: HGBA1C     Assessment & Plan:  1. Acute non-recurrent pansinusitis Start z-pack. Take as directed for 5 days. Rest and increase fluids. Continue using OTC medication to control symptoms.   - azithromycin (ZITHROMAX) 250 MG tablet; z-pack - take as directed for 5 days  Dispense: 6 tablet; Refill: 0  2. Acute cough Prescribed tussionex cough suppressant to take twice daily as needed for cough.  Recommended she use at night and when at home only, as this medication can cause significant drowsiness and dissiness.  She voiced understanding and agreement with instructions . - chlorpheniramine-HYDROcodone (TUSSIONEX PENNKINETIC ER) 10-8 MG/5ML SUER; Take 5 mLs by mouth every 12 (twelve) hours as needed for cough.  Dispense: 115 mL; Refill: 0  3. Body mass index (BMI) of 35.0-35.9 in adult Discussed lowering calorie intake to 1500 calories per day and incorporating exercise into daily routine to help lose weight. Will monitor.    Problem List Items Addressed This Visit       Respiratory   Acute non-recurrent pansinusitis - Primary   Relevant Medications   azithromycin (ZITHROMAX) 250 MG tablet   chlorpheniramine-HYDROcodone (TUSSIONEX PENNKINETIC ER) 10-8 MG/5ML SUER     Other   Body mass index (BMI) of 35.0-35.9 in adult   Acute cough   Relevant Medications   chlorpheniramine-HYDROcodone (TUSSIONEX PENNKINETIC ER) 10-8 MG/5ML SUER     Meds ordered this encounter  Medications   azithromycin (ZITHROMAX) 250 MG tablet    Sig: z-pack - take as directed for 5 days    Dispense:  6 tablet    Refill:  0    Order Specific Question:   Supervising Provider    Answer:   Beatrice Lecher D [[id number]]   chlorpheniramine-HYDROcodone (TUSSIONEX PENNKINETIC ER) 10-8 MG/5ML SUER    Sig: Take 5 mLs by mouth every 12 (twelve) hours as needed for cough.    Dispense:  115 mL    Refill:  0    Order Specific Question:   Supervising Provider    Answer:   Beatrice Lecher D [[id number]]      Ronnell Freshwater, NP

## 2021-08-22 NOTE — Patient Instructions (Signed)
Fat and Cholesterol Restricted Eating Plan Getting too much fat and cholesterol in your diet may cause health problems. Choosing the right foods helps keep your fat and cholesterol at normal levels. This can keep you from getting certain diseases. Your doctor may recommend an eating plan that includes: Total fat: ______% or less of total calories a day. This is ______g of fat a day. Saturated fat: ______% or less of total calories a day. This is ______g of saturated fat a day. Cholesterol: less than _________mg a day. Fiber: ______g a day. What are tips for following this plan? General tips Work with your doctor to lose weight if you need to. Avoid: Foods with added sugar. Fried foods. Foods with trans fat or partially hydrogenated oils. This includes some margarines and baked goods. If you drink alcohol: Limit how much you have to: 0-1 drink a day for women who are not pregnant. 0-2 drinks a day for men. Know how much alcohol is in a drink. In the [place] one drink equals one 12 oz bottle of beer (355 mL), one 5 oz glass of wine (148 mL), or one 1 oz glass of hard liquor (44 mL). Reading food labels Check food labels for: Trans fats. Partially hydrogenated oils. Saturated fat (g) in each serving. Cholesterol (mg) in each serving. Fiber (g) in each serving. Choose foods with healthy fats, such as: Monounsaturated fats and polyunsaturated fats. These include olive and canola oil, flaxseeds, walnuts, almonds, and seeds. Omega-3 fats. These are found in certain fish, flaxseed oil, and ground flaxseeds. Choose grain products that have whole grains. Look for the word "whole" as the first word in the ingredient list. Cooking Cook foods using low-fat methods. These include baking, boiling, grilling, and broiling. Eat more home-cooked foods. Eat at restaurants and buffets less often. Eat less fast food. Avoid cooking using saturated fats, such as butter, cream, palm oil, palm kernel oil, and  coconut oil. Meal planning  At meals, divide your plate into four equal parts: Fill one-half of your plate with vegetables, green salads, and fruit. Fill one-fourth of your plate with whole grains. Fill one-fourth of your plate with low-fat (lean) protein foods. Eat fish that is high in omega-3 fats at least two times a week. This includes mackerel, tuna, sardines, and salmon. Eat foods that are high in fiber, such as whole grains, beans, apples, pears, berries, broccoli, carrots, peas, and barley. What foods should I eat? Fruits All fresh, canned (in natural juice), or frozen fruits. Vegetables Fresh or frozen vegetables (raw, steamed, roasted, or grilled). Green salads. Grains Whole grains, such as whole wheat or whole grain breads, crackers, cereals, and pasta. Unsweetened oatmeal, bulgur, barley, quinoa, or brown rice. Corn or whole wheat flour tortillas. Meats and other protein foods Ground beef (85% or leaner), grass-fed beef, or beef trimmed of fat. Skinless chicken or turkey. Ground chicken or turkey. Pork trimmed of fat. All fish and seafood. Egg whites. Dried beans, peas, or lentils. Unsalted nuts or seeds. Unsalted canned beans. Nut butters without added sugar or oil. Dairy Low-fat or nonfat dairy products, such as skim or 1% milk, 2% or reduced-fat cheeses, low-fat and fat-free ricotta or cottage cheese, or plain low-fat and nonfat yogurt. Fats and oils Tub margarine without trans fats. Light or reduced-fat mayonnaise and salad dressings. Avocado. Olive, canola, sesame, or safflower oils. The items listed above may not be a complete list of foods and beverages you can eat. Contact a dietitian for more information. What foods   should I avoid? Fruits Canned fruit in heavy syrup. Fruit in cream or butter sauce. Fried fruit. Vegetables Vegetables cooked in cheese, cream, or butter sauce. Fried vegetables. Grains White bread. White pasta. White rice. Cornbread. Bagels, pastries,  and croissants. Crackers and snack foods that contain trans fat and hydrogenated oils. Meats and other protein foods Fatty cuts of meat. Ribs, chicken wings, bacon, sausage, bologna, salami, chitterlings, fatback, hot dogs, bratwurst, and packaged lunch meats. Liver and organ meats. Whole eggs and egg yolks. Chicken and turkey with skin. Fried meat. Dairy Whole or 2% milk, cream, half-and-half, and cream cheese. Whole milk cheeses. Whole-fat or sweetened yogurt. Full-fat cheeses. Nondairy creamers and whipped toppings. Processed cheese, cheese spreads, and cheese curds. Fats and oils Butter, stick margarine, lard, shortening, ghee, or bacon fat. Coconut, palm kernel, and palm oils. Beverages Alcohol. Sugar-sweetened drinks such as sodas, lemonade, and fruit drinks. Sweets and desserts Corn syrup, sugars, honey, and molasses. Candy. Jam and jelly. Syrup. Sweetened cereals. Cookies, pies, cakes, donuts, muffins, and ice cream. The items listed above may not be a complete list of foods and beverages you should avoid. Contact a dietitian for more information. Summary Choosing the right foods helps keep your fat and cholesterol at normal levels. This can keep you from getting certain diseases. At meals, fill one-half of your plate with vegetables, green salads, and fruits. Eat high fiber foods, like whole grains, beans, apples, pears, berries, carrots, peas, and barley. Limit added sugar, saturated fats, alcohol, and fried foods. This information is not intended to replace advice given to you by your health care provider. Make sure you discuss any questions you have with your health care provider. Document Revised: 02/03/2021 Document Reviewed: 02/03/2021 Elsevier Patient Education  2022 Elsevier Inc.  

## 2021-12-04 ENCOUNTER — Encounter: Payer: Self-pay | Admitting: Nurse Practitioner

## 2021-12-04 ENCOUNTER — Other Ambulatory Visit: Payer: Self-pay

## 2021-12-04 ENCOUNTER — Ambulatory Visit: Payer: Managed Care, Other (non HMO) | Admitting: Nurse Practitioner

## 2021-12-04 VITALS — BP 133/78 | HR 73 | Temp 98.2°F | Ht 72.0 in | Wt 270.7 lb

## 2021-12-04 DIAGNOSIS — Z6836 Body mass index (BMI) 36.0-36.9, adult: Secondary | ICD-10-CM

## 2021-12-04 DIAGNOSIS — G43009 Migraine without aura, not intractable, without status migrainosus: Secondary | ICD-10-CM | POA: Diagnosis not present

## 2021-12-04 MED ORDER — PHENTERMINE HCL 37.5 MG PO TABS: 37.5000 mg | ORAL_TABLET | Freq: Every day | ORAL | 0 refills | Status: DC

## 2021-12-04 NOTE — Progress Notes (Signed)
Established patient visit   Patient: Kaylee Rodgers   DOB: 05/30/1986   36 y.o. Female  MRN: [id number] Visit Date: 12/04/2021   Chief Complaint  Patient presents with   Migraine   Weight Check   Subjective    Migraine  Pertinent negatives include no abdominal pain, back pain, coughing, dizziness, fever, nausea, rhinorrhea, sinus pressure, sore throat, vomiting or weakness.   He patient is here for follow up of weight management. She has taken a break from phentermine for last few months. Had been having family related stress and decreased exercise. Decided t stop medication while she was not actively participating in physical activity. She would like to restart phentermine. Has done well in the past. Ready to restart.  Migraines are doing well. Slightly better. Less frequent and less severe. She has got new glasses and this is helping. Does well with ubrelvy to treat as indicated.    Medications: Outpatient Medications Prior to Visit  Medication Sig   Cholecalciferol (VITAMIN D3) 1000 units CAPS Take by mouth.   hydrOXYzine (ATARAX/VISTARIL) 25 MG tablet Take 1/2 to 1 tablet po QD prn acute anxiety   Ubrogepant (UBRELVY) 100 MG TABS Take 100 mg by mouth daily as needed.   [DISCONTINUED] chlorpheniramine-HYDROcodone (TUSSIONEX PENNKINETIC ER) 10-8 MG/5ML SUER Take 5 mLs by mouth every 12 (twelve) hours as needed for cough.   [DISCONTINUED] azithromycin (ZITHROMAX) 250 MG tablet z-pack - take as directed for 5 days   [DISCONTINUED] mupirocin ointment (BACTROBAN) 2 % Apply 1 application topically 2 (two) times daily.   No facility-administered medications prior to visit.    Review of Systems  Constitutional:  Negative for activity change, appetite change, chills, fatigue and fever.       Six pound weight gain since most recent visit   HENT:  Negative for congestion, postnasal drip, rhinorrhea, sinus pressure, sinus pain, sneezing and sore throat.   Eyes: Negative.    Respiratory:  Negative for cough, chest tightness, shortness of breath and wheezing.   Cardiovascular:  Negative for chest pain and palpitations.  Gastrointestinal:  Negative for abdominal pain, constipation, diarrhea, nausea and vomiting.  Endocrine: Negative for cold intolerance, heat intolerance, polydipsia and polyuria.  Genitourinary:  Negative for dyspareunia, dysuria, flank pain, frequency and urgency.  Musculoskeletal:  Negative for arthralgias, back pain and myalgias.  Skin:  Negative for rash.  Allergic/Immunologic: Negative for environmental allergies.  Neurological:  Positive for headaches. Negative for dizziness and weakness.       Improved migraine frequency and severity.   Hematological:  Negative for adenopathy.  Psychiatric/Behavioral:  The patient is not nervous/anxious.       Objective     Today's Vitals   12/04/21 0816  BP: 133/78  Pulse: 73  Temp: 98.2 F (36.8 C)  SpO2: 98%  Weight: 270 lb 11.2 oz (122.8 kg)  Height: 6' (1.829 m)   Body mass index is 36.71 kg/m.   BP Readings from Last 3 Encounters:  12/04/21 133/78  08/22/21 131/86  08/01/21 120/81    Wt Readings from Last 3 Encounters:  12/04/21 270 lb 11.2 oz (122.8 kg)  08/22/21 264 lb 11.2 oz (120.1 kg)  08/01/21 261 lb (118.4 kg)    Physical Exam Vitals and nursing note reviewed.  Constitutional:      Appearance: Normal appearance. She is well-developed.  HENT:     Head: Normocephalic and atraumatic.  Eyes:     Pupils: Pupils are equal, round, and reactive to light.  Cardiovascular:  Rate and Rhythm: Normal rate and regular rhythm.     Pulses: Normal pulses.     Heart sounds: Normal heart sounds.  Pulmonary:     Effort: Pulmonary effort is normal.     Breath sounds: Normal breath sounds.  Abdominal:     Palpations: Abdomen is soft.  Musculoskeletal:        General: Normal range of motion.     Cervical back: Normal range of motion and neck supple.  Lymphadenopathy:      Cervical: No cervical adenopathy.  Skin:    General: Skin is warm and dry.     Capillary Refill: Capillary refill takes less than 2 seconds.  Neurological:     General: No focal deficit present.     Mental Status: She is alert and oriented to person, place, and time.  Psychiatric:        Mood and Affect: Mood normal.        Behavior: Behavior normal.        Thought Content: Thought content normal.        Judgment: Judgment normal.      Assessment & Plan    1. Migraine without aura and without status migrainosus, not intractable The patient reports improved migraines. Continue ubrelvy as needed for acute migraines.   2. Body mass index (BMI) of 36.0-36.9 in adult Restart phentermine today. Discussed lowering calorie intake to 1500 calories per day and incorporating exercise into daily routine to help lose weight.  - phentermine (ADIPEX-P) 37.5 MG tablet; Take 1 tablet (37.5 mg total) by mouth daily before breakfast.  Dispense: 30 tablet; Refill: 0    Problem List Items Addressed This Visit       Cardiovascular and Mediastinum   Migraine without aura and without status migrainosus, not intractable - Primary     Other   Body mass index (BMI) of 36.0-36.9 in adult   Relevant Medications   phentermine (ADIPEX-P) 37.5 MG tablet     Return in about 4 weeks (around 01/01/2022) for routine - weight management.         Carlean Jews, NP  [facility] at [facility] [phone] (phone) [phone] (fax)  [facility]

## 2021-12-04 NOTE — Patient Instructions (Signed)
Fat and Cholesterol Restricted Eating Plan Getting too much fat and cholesterol in your diet may cause health problems. Choosing the right foods helps keep your fat and cholesterol at normal levels. This can keep you from getting certain diseases. Your doctor may recommend an eating plan that includes: Total fat: ______% or less of total calories a day. This is ______g of fat a day. Saturated fat: ______% or less of total calories a day. This is ______g of saturated fat a day. Cholesterol: less than _________mg a day. Fiber: ______g a day. What are tips for following this plan? General tips Work with your doctor to lose weight if you need to. Avoid: Foods with added sugar. Fried foods. Foods with trans fat or partially hydrogenated oils. This includes some margarines and baked goods. If you drink alcohol: Limit how much you have to: 0-1 drink a day for women who are not pregnant. 0-2 drinks a day for men. Know how much alcohol is in a drink. In the [place] one drink equals one 12 oz bottle of beer (355 mL), one 5 oz glass of wine (148 mL), or one 1 oz glass of hard liquor (44 mL). Reading food labels Check food labels for: Trans fats. Partially hydrogenated oils. Saturated fat (g) in each serving. Cholesterol (mg) in each serving. Fiber (g) in each serving. Choose foods with healthy fats, such as: Monounsaturated fats and polyunsaturated fats. These include olive and canola oil, flaxseeds, walnuts, almonds, and seeds. Omega-3 fats. These are found in certain fish, flaxseed oil, and ground flaxseeds. Choose grain products that have whole grains. Look for the word "whole" as the first word in the ingredient list. Cooking Cook foods using low-fat methods. These include baking, boiling, grilling, and broiling. Eat more home-cooked foods. Eat at restaurants and buffets less often. Eat less fast food. Avoid cooking using saturated fats, such as butter, cream, palm oil, palm kernel oil, and  coconut oil. Meal planning  At meals, divide your plate into four equal parts: Fill one-half of your plate with vegetables, green salads, and fruit. Fill one-fourth of your plate with whole grains. Fill one-fourth of your plate with low-fat (lean) protein foods. Eat fish that is high in omega-3 fats at least two times a week. This includes mackerel, tuna, sardines, and salmon. Eat foods that are high in fiber, such as whole grains, beans, apples, pears, berries, broccoli, carrots, peas, and barley. What foods should I eat? Fruits All fresh, canned (in natural juice), or frozen fruits. Vegetables Fresh or frozen vegetables (raw, steamed, roasted, or grilled). Green salads. Grains Whole grains, such as whole wheat or whole grain breads, crackers, cereals, and pasta. Unsweetened oatmeal, bulgur, barley, quinoa, or brown rice. Corn or whole wheat flour tortillas. Meats and other protein foods Ground beef (85% or leaner), grass-fed beef, or beef trimmed of fat. Skinless chicken or turkey. Ground chicken or turkey. Pork trimmed of fat. All fish and seafood. Egg whites. Dried beans, peas, or lentils. Unsalted nuts or seeds. Unsalted canned beans. Nut butters without added sugar or oil. Dairy Low-fat or nonfat dairy products, such as skim or 1% milk, 2% or reduced-fat cheeses, low-fat and fat-free ricotta or cottage cheese, or plain low-fat and nonfat yogurt. Fats and oils Tub margarine without trans fats. Light or reduced-fat mayonnaise and salad dressings. Avocado. Olive, canola, sesame, or safflower oils. The items listed above may not be a complete list of foods and beverages you can eat. Contact a dietitian for more information. What foods   should I avoid? Fruits Canned fruit in heavy syrup. Fruit in cream or butter sauce. Fried fruit. Vegetables Vegetables cooked in cheese, cream, or butter sauce. Fried vegetables. Grains White bread. White pasta. White rice. Cornbread. Bagels, pastries,  and croissants. Crackers and snack foods that contain trans fat and hydrogenated oils. Meats and other protein foods Fatty cuts of meat. Ribs, chicken wings, bacon, sausage, bologna, salami, chitterlings, fatback, hot dogs, bratwurst, and packaged lunch meats. Liver and organ meats. Whole eggs and egg yolks. Chicken and turkey with skin. Fried meat. Dairy Whole or 2% milk, cream, half-and-half, and cream cheese. Whole milk cheeses. Whole-fat or sweetened yogurt. Full-fat cheeses. Nondairy creamers and whipped toppings. Processed cheese, cheese spreads, and cheese curds. Fats and oils Butter, stick margarine, lard, shortening, ghee, or bacon fat. Coconut, palm kernel, and palm oils. Beverages Alcohol. Sugar-sweetened drinks such as sodas, lemonade, and fruit drinks. Sweets and desserts Corn syrup, sugars, honey, and molasses. Candy. Jam and jelly. Syrup. Sweetened cereals. Cookies, pies, cakes, donuts, muffins, and ice cream. The items listed above may not be a complete list of foods and beverages you should avoid. Contact a dietitian for more information. Summary Choosing the right foods helps keep your fat and cholesterol at normal levels. This can keep you from getting certain diseases. At meals, fill one-half of your plate with vegetables, green salads, and fruits. Eat high fiber foods, like whole grains, beans, apples, pears, berries, carrots, peas, and barley. Limit added sugar, saturated fats, alcohol, and fried foods. This information is not intended to replace advice given to you by your health care provider. Make sure you discuss any questions you have with your health care provider. Document Revised: 02/03/2021 Document Reviewed: 02/03/2021 Elsevier Patient Education  2022 Elsevier Inc.  

## 2021-12-06 ENCOUNTER — Encounter: Payer: Self-pay | Admitting: Nurse Practitioner

## 2021-12-19 ENCOUNTER — Encounter: Payer: Self-pay | Admitting: Nurse Practitioner

## 2021-12-19 ENCOUNTER — Other Ambulatory Visit: Payer: Self-pay | Admitting: Nurse Practitioner

## 2021-12-19 DIAGNOSIS — Z6836 Body mass index (BMI) 36.0-36.9, adult: Secondary | ICD-10-CM

## 2021-12-19 MED ORDER — PHENTERMINE HCL 37.5 MG PO TABS: 37.5000 mg | ORAL_TABLET | Freq: Every day | ORAL | 0 refills | Status: DC

## 2021-12-19 NOTE — Progress Notes (Signed)
Sent new prescription for phentermine to [facility].  ?

## 2022-01-01 ENCOUNTER — Ambulatory Visit: Payer: Managed Care, Other (non HMO) | Admitting: Nurse Practitioner

## 2022-01-21 NOTE — Progress Notes (Signed)
Established patient visit ? ? ?Patient: Kaylee Rodgers   DOB: August 27, 1986   36 y.o. Female  MRN: [id number] ?Visit Date: 01/22/2022 ? ? ?Chief Complaint  ?Patient presents with  ? Weight Check  ? ?Subjective  ?  ?HPI  ?Follow up visit.  ?-restarted phentermine 01/2021. Starting weight was 276 pounds.  ?-has been taking this off and on.  ?-most recent weight 12/04/2021 - 270 pounds  ?-today's weight is 258 ?-weight change since mos recent visit 12 pounds  ?-weight change since starting back on phentermine - 18 pounds  ?-no negative side effects are reported.  ?-has started doing eight hour intermittent fasting which seems to be helping.  ?-she states that she is getting up more often and will start going back to water aerobics tonight. She is unsure of the frequency right now.  ? ?-Some increased family stress. Mom had weight loss surgery. Has put some added stress on the patient and her siblings.  ? ? ?Medications: ?Outpatient Medications Prior to Visit  ?Medication Sig  ? Cholecalciferol (VITAMIN D3) 1000 units CAPS Take by mouth.  ? hydrOXYzine (ATARAX/VISTARIL) 25 MG tablet Take 1/2 to 1 tablet po QD prn acute anxiety  ? Ubrogepant (UBRELVY) 100 MG TABS Take 100 mg by mouth daily as needed.  ? [DISCONTINUED] phentermine (ADIPEX-P) 37.5 MG tablet Take 1 tablet (37.5 mg total) by mouth daily before breakfast.  ? ?No facility-administered medications prior to visit.  ? ? ?Review of Systems  ?Constitutional:  Negative for activity change, appetite change, chills, fatigue and fever.  ?     Twelve pound weight loss since most recent visit   ?HENT:  Negative for congestion, postnasal drip, rhinorrhea, sinus pressure, sinus pain, sneezing and sore throat.   ?Eyes: Negative.   ?Respiratory:  Negative for cough, chest tightness, shortness of breath and wheezing.   ?Cardiovascular:  Negative for chest pain and palpitations.  ?Gastrointestinal:  Negative for abdominal pain, constipation, diarrhea, nausea and vomiting.   ?Endocrine: Negative for cold intolerance, heat intolerance, polydipsia and polyuria.  ?Genitourinary:  Negative for dyspareunia, dysuria, flank pain, frequency and urgency.  ?Musculoskeletal:  Negative for arthralgias, back pain and myalgias.  ?Skin:  Negative for rash.  ?Allergic/Immunologic: Negative for environmental allergies.  ?Neurological:  Negative for dizziness, weakness and headaches.  ?Hematological:  Negative for adenopathy.  ?Psychiatric/Behavioral:  The patient is nervous/anxious.   ? ? ? Objective  ?  ? ?Today's Vitals  ? 01/22/22 1105  ?BP: 127/80  ?Pulse: 64  ?Temp: 98 ?F (36.7 ?C)  ?SpO2: 100%  ?Weight: 258 lb 12.8 oz (117.4 kg)  ?Height: 6' 0.05" (1.83 m)  ? ?Body mass index is 35.05 kg/m?.  ? ?BP Readings from Last 3 Encounters:  ?01/22/22 127/80  ?12/04/21 133/78  ?08/22/21 131/86  ?  ?Wt Readings from Last 3 Encounters:  ?01/22/22 258 lb 12.8 oz (117.4 kg)  ?12/04/21 270 lb 11.2 oz (122.8 kg)  ?08/22/21 264 lb 11.2 oz (120.1 kg)  ?  ?Physical Exam ?Vitals and nursing note reviewed.  ?Constitutional:   ?   Appearance: Normal appearance. She is well-developed.  ?HENT:  ?   Head: Normocephalic and atraumatic.  ?   Nose: Nose normal.  ?Eyes:  ?   Pupils: Pupils are equal, round, and reactive to light.  ?Cardiovascular:  ?   Rate and Rhythm: Normal rate and regular rhythm.  ?   Pulses: Normal pulses.  ?   Heart sounds: Normal heart sounds.  ?Pulmonary:  ?  Effort: Pulmonary effort is normal.  ?   Breath sounds: Normal breath sounds.  ?Abdominal:  ?   Palpations: Abdomen is soft.  ?Musculoskeletal:     ?   General: Normal range of motion.  ?   Cervical back: Normal range of motion and neck supple.  ?Lymphadenopathy:  ?   Cervical: No cervical adenopathy.  ?Skin: ?   General: Skin is warm and dry.  ?   Capillary Refill: Capillary refill takes less than 2 seconds.  ?Neurological:  ?   General: No focal deficit present.  ?   Mental Status: She is alert and oriented to person, place, and time.   ?Psychiatric:     ?   Mood and Affect: Mood normal.     ?   Behavior: Behavior normal.     ?   Thought Content: Thought content normal.     ?   Judgment: Judgment normal.  ?  ? ? Assessment & Plan  ?  ?1. Situational stress ?Patient's mother recently had major abdominal surgery. Increased stress on her and her siblings. Gradually improving.  ? ?2. Body mass index (BMI) of 35.0-35.9 in adult ?Very good response this past 30 days. May continue phentermine 37.5mg  tablets daily. Continue with intermittent fasting. Add in exercise starting today. Reassess in 4 weeks.  ?- phentermine (ADIPEX-P) 37.5 MG tablet; Take 1 tablet (37.5 mg total) by mouth daily before breakfast.  Dispense: 30 tablet; Refill: 0  ? ? ?Problem List Items Addressed This Visit   ? ?  ? Other  ? Body mass index (BMI) of 35.0-35.9 in adult  ? Relevant Medications  ? phentermine (ADIPEX-P) 37.5 MG tablet  ? Situational stress - Primary  ?  ? ?Return in about 4 weeks (around 02/19/2022).  ?   ? ? ? ? ?Carlean Jews, NP  ?[facility] at [facility] ?[phone] (phone) ?[phone] (fax) ? ?[facility]  ?

## 2022-01-22 ENCOUNTER — Ambulatory Visit: Payer: Managed Care, Other (non HMO) | Admitting: Nurse Practitioner

## 2022-01-22 ENCOUNTER — Encounter: Payer: Self-pay | Admitting: Nurse Practitioner

## 2022-01-22 ENCOUNTER — Ambulatory Visit (INDEPENDENT_AMBULATORY_CARE_PROVIDER_SITE_OTHER): Payer: Managed Care, Other (non HMO) | Admitting: Nurse Practitioner

## 2022-01-22 VITALS — BP 127/80 | HR 64 | Temp 98.0°F | Ht 72.05 in | Wt 258.8 lb

## 2022-01-22 DIAGNOSIS — F439 Reaction to severe stress, unspecified: Secondary | ICD-10-CM | POA: Insufficient documentation

## 2022-01-22 DIAGNOSIS — Z6835 Body mass index (BMI) 35.0-35.9, adult: Secondary | ICD-10-CM

## 2022-01-22 MED ORDER — PHENTERMINE HCL 37.5 MG PO TABS: 37.5000 mg | ORAL_TABLET | Freq: Every day | ORAL | 0 refills | Status: DC

## 2022-02-18 NOTE — Progress Notes (Signed)
Established patient visit ? ? ?Patient: Kaylee Rodgers   DOB: September 15, 1986   36 y.o. Female  MRN: [id number] ?Visit Date: 02/19/2022 ? ? ?Chief Complaint  ?Patient presents with  ? Weight Check  ? ?Subjective  ?  ?HPI  ?Patient presenting for weight management.  ?-restarted phentermine 01/2021. Starting weight was 276 pounds.  ?-has been taking this off and on.  ?-most recent weight 01/22/2022 - 258 pounds  ?-today's weight 02/18/2022 - 256 pounds  ?-weight change since mos recent visit - 2 ?-weight change since starting back on phentermine - 20 pounds  ?She has no negative side effects associated with taking this medication. It is still controlling her appetite. She is doing intermittent fasting along with phentermine.  ?She admits to not exercising routinely. Has played volleyball a few times this month, but nothing organized. Wants to start going back to water aerobics in next two months.  ?-having some increased stress. Having to rehome her dog. Does take vstaril at times to help with anxiety.  ? ? ?Medications: ?Outpatient Medications Prior to Visit  ?Medication Sig  ? Cholecalciferol (VITAMIN D3) 1000 units CAPS Take by mouth.  ? hydrOXYzine (ATARAX/VISTARIL) 25 MG tablet Take 1/2 to 1 tablet po QD prn acute anxiety  ? Ubrogepant (UBRELVY) 100 MG TABS Take 100 mg by mouth daily as needed.  ? [DISCONTINUED] phentermine (ADIPEX-P) 37.5 MG tablet Take 1 tablet (37.5 mg total) by mouth daily before breakfast.  ? ?No facility-administered medications prior to visit.  ? ? ?Review of Systems  ?Constitutional:  Negative for activity change, appetite change, chills, fatigue and fever.  ?     Two pound weight loss since most recent visit   ?HENT:  Negative for congestion, postnasal drip, rhinorrhea, sinus pressure, sinus pain, sneezing and sore throat.   ?Eyes: Negative.   ?Respiratory:  Negative for cough, chest tightness, shortness of breath and wheezing.   ?Cardiovascular:  Negative for chest pain and palpitations.   ?Gastrointestinal:  Negative for abdominal pain, constipation, diarrhea, nausea and vomiting.  ?Endocrine: Negative for cold intolerance, heat intolerance, polydipsia and polyuria.  ?Genitourinary:  Negative for dyspareunia, dysuria, flank pain, frequency and urgency.  ?Musculoskeletal:  Negative for arthralgias, back pain and myalgias.  ?Skin:  Negative for rash.  ?Allergic/Immunologic: Negative for environmental allergies.  ?Neurological:  Negative for dizziness, weakness and headaches.  ?Hematological:  Negative for adenopathy.  ?Psychiatric/Behavioral:  The patient is not nervous/anxious.   ? ? ? ? Objective  ?  ? ?Today's Vitals  ? 02/19/22 1032  ?BP: 123/79  ?Pulse: 73  ?Temp: (!) 97.4 ?F (36.3 ?C)  ?SpO2: 100%  ?Weight: 256 lb 12.8 oz (116.5 kg)  ?Height: 6' 0.05" (1.83 m)  ? ?Body mass index is 34.78 kg/m?.  ? ?BP Readings from Last 3 Encounters:  ?02/19/22 123/79  ?01/22/22 127/80  ?12/04/21 133/78  ?  ?Wt Readings from Last 3 Encounters:  ?02/19/22 256 lb 12.8 oz (116.5 kg)  ?01/22/22 258 lb 12.8 oz (117.4 kg)  ?12/04/21 270 lb 11.2 oz (122.8 kg)  ?  ?Physical Exam ?Vitals and nursing note reviewed.  ?Constitutional:   ?   Appearance: Normal appearance. She is well-developed.  ?HENT:  ?   Head: Normocephalic and atraumatic.  ?Eyes:  ?   Pupils: Pupils are equal, round, and reactive to light.  ?Cardiovascular:  ?   Rate and Rhythm: Normal rate and regular rhythm.  ?   Pulses: Normal pulses.  ?   Heart sounds: Normal heart sounds.  ?  Pulmonary:  ?   Effort: Pulmonary effort is normal.  ?   Breath sounds: Normal breath sounds.  ?Abdominal:  ?   Palpations: Abdomen is soft.  ?Musculoskeletal:     ?   General: Normal range of motion.  ?   Cervical back: Normal range of motion and neck supple.  ?Lymphadenopathy:  ?   Cervical: No cervical adenopathy.  ?Skin: ?   General: Skin is warm and dry.  ?   Capillary Refill: Capillary refill takes less than 2 seconds.  ?Neurological:  ?   General: No focal deficit  present.  ?   Mental Status: She is alert and oriented to person, place, and time.  ?Psychiatric:     ?   Mood and Affect: Mood normal.     ?   Behavior: Behavior normal.     ?   Thought Content: Thought content normal.     ?   Judgment: Judgment normal.  ?  ? ? Assessment & Plan  ?  ?1. Situational stress ?Advised patient she may take hydroxyzine as needed and as prescribed for acute anxiety.  ? ?2. Migraine without aura and without status migrainosus, not intractable ?May take ubrelvy as needed and as prescribed.  ? ?3. Body mass index (BMI) of 34.0-34.9 in adult ?Improving. May continue to phentermine.  Discussed lowering calorie intake to 1500 calories per day and incorporating exercise into daily routine to help lose weight. Encouraged her to start with exercise program as soon as possible.  ? ?Problem List Items Addressed This Visit   ? ?  ? Cardiovascular and Mediastinum  ? Migraine without aura and without status migrainosus, not intractable  ?  ? Other  ? Body mass index (BMI) of 34.0-34.9 in adult  ? Relevant Medications  ? phentermine (ADIPEX-P) 37.5 MG tablet  ? Situational stress - Primary  ? ?Other Visit Diagnoses   ? ? Body mass index (BMI) of 35.0-35.9 in adult      ? Relevant Medications  ? phentermine (ADIPEX-P) 37.5 MG tablet  ? ?  ?  ? ?Return in 4 weeks (on 03/19/2022).  ?   ? ? ? ? ?Carlean Jews, NP  ?[facility] at [facility] ?[phone] (phone) ?[phone] (fax) ? ?[facility]  ?

## 2022-02-19 ENCOUNTER — Encounter: Payer: Self-pay | Admitting: Nurse Practitioner

## 2022-02-19 ENCOUNTER — Ambulatory Visit (INDEPENDENT_AMBULATORY_CARE_PROVIDER_SITE_OTHER): Payer: Managed Care, Other (non HMO) | Admitting: Nurse Practitioner

## 2022-02-19 VITALS — BP 123/79 | HR 73 | Temp 97.4°F | Ht 72.05 in | Wt 256.8 lb

## 2022-02-19 DIAGNOSIS — Z6834 Body mass index (BMI) 34.0-34.9, adult: Secondary | ICD-10-CM | POA: Diagnosis not present

## 2022-02-19 DIAGNOSIS — G43009 Migraine without aura, not intractable, without status migrainosus: Secondary | ICD-10-CM

## 2022-02-19 DIAGNOSIS — F439 Reaction to severe stress, unspecified: Secondary | ICD-10-CM | POA: Diagnosis not present

## 2022-02-19 DIAGNOSIS — Z6835 Body mass index (BMI) 35.0-35.9, adult: Secondary | ICD-10-CM

## 2022-02-19 MED ORDER — PHENTERMINE HCL 37.5 MG PO TABS: 37.5000 mg | ORAL_TABLET | Freq: Every day | ORAL | 0 refills | Status: DC

## 2022-03-20 ENCOUNTER — Ambulatory Visit: Payer: Managed Care, Other (non HMO) | Admitting: Nurse Practitioner

## 2022-03-26 NOTE — Progress Notes (Unsigned)
Established patient visit   Patient: Kaylee Rodgers   DOB: 12/08/1985   36 y.o. Female  MRN: [id number] Visit Date: 03/27/2022   No chief complaint on file.  Subjective    HPI  -restarted phentermine 01/2021. Starting weight was 276 pounds.  -has been taking this off and on.  -most recent weight 02/18/2022  - 256 pounds  -today's weight 03/27/2022 -  -weight change since mos recent visit - 2 -weight change since starting back on phentermine - 20 pounds    Medications: Outpatient Medications Prior to Visit  Medication Sig   Cholecalciferol (VITAMIN D3) 1000 units CAPS Take by mouth.   hydrOXYzine (ATARAX/VISTARIL) 25 MG tablet Take 1/2 to 1 tablet po QD prn acute anxiety   phentermine (ADIPEX-P) 37.5 MG tablet Take 1 tablet (37.5 mg total) by mouth daily before breakfast.   Ubrogepant (UBRELVY) 100 MG TABS Take 100 mg by mouth daily as needed.   No facility-administered medications prior to visit.    Review of Systems  {Labs (Optional):23779}   Objective    There were no vitals taken for this visit. BP Readings from Last 3 Encounters:  02/19/22 123/79  01/22/22 127/80  12/04/21 133/78    Wt Readings from Last 3 Encounters:  02/19/22 256 lb 12.8 oz (116.5 kg)  01/22/22 258 lb 12.8 oz (117.4 kg)  12/04/21 270 lb 11.2 oz (122.8 kg)    Physical Exam  ***  No results found for any visits on 03/27/22.  Assessment & Plan     Problem List Items Addressed This Visit   None    Return in about 4 weeks (around 04/24/2022) for routine - weight management.         Carlean Jews, NP  [facility] at [facility] [phone] (phone) [phone] (fax)  [facility]

## 2022-03-27 ENCOUNTER — Ambulatory Visit (INDEPENDENT_AMBULATORY_CARE_PROVIDER_SITE_OTHER): Payer: Managed Care, Other (non HMO) | Admitting: Nurse Practitioner

## 2022-03-27 ENCOUNTER — Encounter: Payer: Self-pay | Admitting: Nurse Practitioner

## 2022-03-27 VITALS — BP 127/80 | HR 71 | Temp 97.5°F | Ht 72.05 in | Wt 253.9 lb

## 2022-03-27 DIAGNOSIS — R5383 Other fatigue: Secondary | ICD-10-CM

## 2022-03-27 DIAGNOSIS — Z0000 Encounter for general adult medical examination without abnormal findings: Secondary | ICD-10-CM

## 2022-03-27 DIAGNOSIS — E559 Vitamin D deficiency, unspecified: Secondary | ICD-10-CM | POA: Diagnosis not present

## 2022-03-27 DIAGNOSIS — Z6834 Body mass index (BMI) 34.0-34.9, adult: Secondary | ICD-10-CM

## 2022-03-27 MED ORDER — PHENTERMINE HCL 37.5 MG PO TABS: 37.5000 mg | ORAL_TABLET | Freq: Every day | ORAL | 0 refills | Status: DC

## 2022-04-24 ENCOUNTER — Encounter: Payer: Self-pay | Admitting: Nurse Practitioner

## 2022-04-24 ENCOUNTER — Ambulatory Visit (INDEPENDENT_AMBULATORY_CARE_PROVIDER_SITE_OTHER): Payer: Managed Care, Other (non HMO) | Admitting: Nurse Practitioner

## 2022-04-24 VITALS — BP 121/77 | HR 68 | Ht 72.05 in | Wt 255.0 lb

## 2022-04-24 DIAGNOSIS — F439 Reaction to severe stress, unspecified: Secondary | ICD-10-CM

## 2022-04-24 DIAGNOSIS — Z6834 Body mass index (BMI) 34.0-34.9, adult: Secondary | ICD-10-CM | POA: Diagnosis not present

## 2022-04-24 MED ORDER — PHENTERMINE HCL 37.5 MG PO TABS
37.5000 mg | ORAL_TABLET | Freq: Every day | ORAL | 0 refills | Status: DC
Start: 2022-04-24 — End: 2023-03-25

## 2022-04-24 MED ORDER — TRAZODONE HCL 50 MG PO TABS: 25.0000 mg | ORAL_TABLET | Freq: Every evening | ORAL | 2 refills | Status: DC | PRN

## 2022-04-24 NOTE — Progress Notes (Signed)
Established patient visit   Patient: Kaylee Rodgers   DOB: [birth year]   36 y.o. Female  MRN: [id number] Visit Date: 04/24/2022   Chief Complaint  Patient presents with   Weight Check   Subjective    HPI  -restarted phentermine 01/2021. Starting weight was 276 pounds.  -has been taking this off and on.  -most recent weight 03/27/2022  - 253 pounds  -today's weight 04/24/2022 - 255 -weight change since mos recent visit - 2 pound weight gain  -weight change since starting back on phentermine - 21 pounds  -started working out 2 to 5 times per week. She states that she is hungrier since she started working out. She is doing cardio - 30 to 38 minutes each time. She and her wofe have switched diet also. Eating much more vegetables than meat and starches. She states that she has not lost any weight, but feels like her clothing is fitting differently and feels less fatigued  Side effects from taking phentermine - none. Has increased energy and focus.   -continues to have increased anxiety. Did trial of atarax 25 mg. She states that taking 1/2 tablet did not help and 1 tablet caused her to be very sleepy, especially the next day.   -has appointment scheduled to get labs done next Thursday.    Medications: Outpatient Medications Prior to Visit  Medication Sig   Cholecalciferol (VITAMIN D3) 1000 units CAPS Take by mouth.   Ubrogepant (UBRELVY) 100 MG TABS Take 100 mg by mouth daily as needed.   [DISCONTINUED] hydrOXYzine (ATARAX/VISTARIL) 25 MG tablet Take 1/2 to 1 tablet po QD prn acute anxiety   [DISCONTINUED] phentermine (ADIPEX-P) 37.5 MG tablet Take 1 tablet (37.5 mg total) by mouth daily before breakfast.   No facility-administered medications prior to visit.    Review of Systems  Constitutional:  Negative for activity change, appetite change, chills, fatigue and fever.       Two pound weight gain since last visit   HENT:  Negative for congestion, postnasal drip, rhinorrhea,  sinus pressure, sinus pain, sneezing and sore throat.   Eyes: Negative.   Respiratory:  Negative for cough, chest tightness, shortness of breath and wheezing.   Cardiovascular:  Negative for chest pain and palpitations.  Gastrointestinal:  Negative for abdominal pain, constipation, diarrhea, nausea and vomiting.  Endocrine: Negative for cold intolerance, heat intolerance, polydipsia and polyuria.  Genitourinary:  Negative for dyspareunia, dysuria, flank pain, frequency and urgency.  Musculoskeletal:  Negative for arthralgias, back pain and myalgias.  Skin:  Negative for rash.  Allergic/Immunologic: Negative for environmental allergies.  Neurological:  Negative for dizziness, weakness and headaches.  Hematological:  Negative for adenopathy.  Psychiatric/Behavioral:  The patient is nervous/anxious.        Objective     Today's Vitals   04/24/22 1517  BP: 121/77  Pulse: 68  SpO2: 100%  Weight: 255 lb (115.7 kg)  Height: 6' 0.05" (1.83 m)   Body mass index is 34.54 kg/m.   BP Readings from Last 3 Encounters:  04/24/22 121/77  03/27/22 127/80  02/19/22 123/79    Wt Readings from Last 3 Encounters:  04/24/22 255 lb (115.7 kg)  03/27/22 253 lb 14.4 oz (115.2 kg)  02/19/22 256 lb 12.8 oz (116.5 kg)    Physical Exam Vitals and nursing note reviewed.  Constitutional:      Appearance: Normal appearance. She is well-developed.  HENT:     Head: Normocephalic and atraumatic.  Eyes:  Pupils: Pupils are equal, round, and reactive to light.  Cardiovascular:     Rate and Rhythm: Normal rate and regular rhythm.     Pulses: Normal pulses.     Heart sounds: Normal heart sounds.  Pulmonary:     Effort: Pulmonary effort is normal.     Breath sounds: Normal breath sounds.  Abdominal:     Palpations: Abdomen is soft.  Musculoskeletal:        General: Normal range of motion.     Cervical back: Normal range of motion and neck supple.  Lymphadenopathy:     Cervical: No cervical  adenopathy.  Skin:    General: Skin is warm and dry.     Capillary Refill: Capillary refill takes less than 2 seconds.  Neurological:     General: No focal deficit present.     Mental Status: She is alert and oriented to person, place, and time.  Psychiatric:        Mood and Affect: Mood normal.        Behavior: Behavior normal.        Thought Content: Thought content normal.        Judgment: Judgment normal.      Assessment & Plan    1. Situational stress Discontinue hydroxyzine. Trial trazodone 50 mg. May take 1/2 to 1 tablet at night as needed for anxiety. Advised her to take with caution and only while not driving or working as this medication would make her tired and/or dizzy. She voiced understanding.  - traZODone (DESYREL) 50 MG tablet; Take 0.5-1 tablets (25-50 mg total) by mouth at bedtime as needed for sleep.  Dispense: 30 tablet; Refill: 2  2. BMI 34.0-34.9,adult Though patient has had weight gain the past 30 days, has increased work outs, likely building muscle mass. May continue phentermine 37.5 mg tablets daily. Continue with recent diet and lifestyle changes. Reassess in 1 month  - phentermine (ADIPEX-P) 37.5 MG tablet; Take 1 tablet (37.5 mg total) by mouth daily before breakfast.  Dispense: 30 tablet; Refill: 0   Problem List Items Addressed This Visit       Other   BMI 34.0-34.9,adult   Relevant Medications   phentermine (ADIPEX-P) 37.5 MG tablet   Situational stress - Primary   Relevant Medications   traZODone (DESYREL) 50 MG tablet     Return in about 4 weeks (around 05/22/2022) for routine - weight management.         Carlean Jews, NP  [facility] at [facility] [phone] (phone) [phone] (fax)  [facility]

## 2022-05-03 LAB — CBC
Hematocrit: 39.6 % (ref 34.0–46.6)
Hemoglobin: 12.8 g/dL (ref 11.1–15.9)
MCH: 30.1 pg (ref 26.6–33.0)
MCHC: 32.3 g/dL (ref 31.5–35.7)
MCV: 93 fL (ref 79–97)
Platelets: 363 10*3/uL (ref 150–450)
RBC: 4.25 x10E6/uL (ref 3.77–5.28)
RDW: 12.5 % (ref 11.7–15.4)
WBC: 7.4 10*3/uL (ref 3.4–10.8)

## 2022-05-03 LAB — COMPREHENSIVE METABOLIC PANEL
ALT: 10 IU/L (ref 0–32)
AST: 10 IU/L (ref 0–40)
Albumin/Globulin Ratio: 2 (ref 1.2–2.2)
Albumin: 4.4 g/dL (ref 3.9–4.9)
Alkaline Phosphatase: 77 IU/L (ref 44–121)
BUN/Creatinine Ratio: 7 — ABNORMAL LOW (ref 9–23)
BUN: 6 mg/dL (ref 6–20)
Bilirubin Total: 0.9 mg/dL (ref 0.0–1.2)
CO2: 22 mmol/L (ref 20–29)
Calcium: 9.2 mg/dL (ref 8.7–10.2)
Chloride: 102 mmol/L (ref 96–106)
Creatinine, Ser: 0.84 mg/dL (ref 0.57–1.00)
Globulin, Total: 2.2 g/dL (ref 1.5–4.5)
Glucose: 79 mg/dL (ref 70–99)
Potassium: 4.5 mmol/L (ref 3.5–5.2)
Sodium: 136 mmol/L (ref 134–144)
Total Protein: 6.6 g/dL (ref 6.0–8.5)
eGFR: 92 mL/min/{1.73_m2} (ref >59)

## 2022-05-03 LAB — LIPID PANEL
Chol/HDL Ratio: 2.4 ratio (ref 0.0–4.4)
Cholesterol, Total: 122 mg/dL (ref 100–199)
HDL: 50 mg/dL (ref >39)
LDL Chol Calc (NIH): 59 mg/dL (ref 0–99)
Triglycerides: 63 mg/dL (ref 0–149)
VLDL Cholesterol Cal: 13 mg/dL (ref 5–40)

## 2022-05-03 LAB — TSH+FREE T4
Free T4: 1.12 ng/dL (ref 0.82–1.77)
TSH: 2.67 u[IU]/mL (ref 0.450–4.500)

## 2022-05-03 LAB — HEMOGLOBIN A1C
Est. average glucose Bld gHb Est-mCnc: 97 mg/dL
Hgb A1c MFr Bld: 5 % (ref 4.8–5.6)

## 2022-05-03 LAB — VITAMIN D 25 HYDROXY (VIT D DEFICIENCY, FRACTURES): Vit D, 25-Hydroxy: 35.2 ng/mL (ref 30.0–100.0)

## 2022-05-20 ENCOUNTER — Encounter: Payer: Self-pay | Admitting: Nurse Practitioner

## 2022-05-22 ENCOUNTER — Ambulatory Visit: Payer: Managed Care, Other (non HMO) | Admitting: Nurse Practitioner

## 2022-06-05 ENCOUNTER — Ambulatory Visit: Payer: Managed Care, Other (non HMO) | Admitting: Nurse Practitioner

## 2022-08-22 ENCOUNTER — Encounter: Payer: Self-pay | Admitting: Nurse Practitioner

## 2022-08-22 ENCOUNTER — Other Ambulatory Visit: Payer: Self-pay | Admitting: Nurse Practitioner

## 2022-08-22 DIAGNOSIS — G43009 Migraine without aura, not intractable, without status migrainosus: Secondary | ICD-10-CM

## 2022-08-22 MED ORDER — UBRELVY 100 MG PO TABS: 100.0000 mg | ORAL_TABLET | Freq: Every day | ORAL | 3 refills | Status: DC | PRN

## 2022-09-13 ENCOUNTER — Ambulatory Visit: Payer: Managed Care, Other (non HMO) | Admitting: Nurse Practitioner

## 2022-09-13 VITALS — BP 131/86 | HR 73 | Temp 97.5°F | Ht 72.0 in | Wt 267.4 lb

## 2022-09-13 DIAGNOSIS — Z23 Encounter for immunization: Secondary | ICD-10-CM

## 2022-09-13 NOTE — Progress Notes (Signed)
Patient is here for her Flu vaccine pt tolerated injection 

## 2023-03-14 ENCOUNTER — Telehealth: Payer: Self-pay | Admitting: *Deleted

## 2023-03-14 NOTE — Telephone Encounter (Signed)
Provider is advised of the paperwork

## 2023-03-14 NOTE — Telephone Encounter (Signed)
Pt has an upcoming appointment and is needing some labwork for her work health screening for this and would like to have it ordered so she can have it done at [facility] before the appointment. The form to be completed has been placed in the provider box so she can have it for reference and for the completion at the visit. Please inform pt when these labs have been placed so she can go get them.

## 2023-03-19 ENCOUNTER — Encounter: Payer: Self-pay | Admitting: Nurse Practitioner

## 2023-03-21 ENCOUNTER — Other Ambulatory Visit: Payer: Self-pay | Admitting: Nurse Practitioner

## 2023-03-21 DIAGNOSIS — R5383 Other fatigue: Secondary | ICD-10-CM

## 2023-03-21 DIAGNOSIS — R7301 Impaired fasting glucose: Secondary | ICD-10-CM

## 2023-03-21 DIAGNOSIS — E559 Vitamin D deficiency, unspecified: Secondary | ICD-10-CM

## 2023-03-21 DIAGNOSIS — E785 Hyperlipidemia, unspecified: Secondary | ICD-10-CM

## 2023-03-23 LAB — COMPREHENSIVE METABOLIC PANEL
ALT: 10 IU/L (ref 0–32)
AST: 10 IU/L (ref 0–40)
Albumin/Globulin Ratio: 1.9
Albumin: 4.3 g/dL (ref 3.9–4.9)
Alkaline Phosphatase: 87 IU/L (ref 44–121)
BUN/Creatinine Ratio: 13 (ref 9–23)
BUN: 11 mg/dL (ref 6–20)
Bilirubin Total: 0.3 mg/dL (ref 0.0–1.2)
CO2: 21 mmol/L (ref 20–29)
Calcium: 8.9 mg/dL (ref 8.7–10.2)
Chloride: 105 mmol/L (ref 96–106)
Creatinine, Ser: 0.85 mg/dL (ref 0.57–1.00)
Globulin, Total: 2.3 g/dL (ref 1.5–4.5)
Glucose: 90 mg/dL (ref 70–99)
Potassium: 4.6 mmol/L (ref 3.5–5.2)
Sodium: 140 mmol/L (ref 134–144)
Total Protein: 6.6 g/dL (ref 6.0–8.5)
eGFR: 90 mL/min/{1.73_m2} (ref >59)

## 2023-03-23 LAB — LIPID PANEL
Chol/HDL Ratio: 3.2 ratio (ref 0.0–4.4)
Cholesterol, Total: 139 mg/dL (ref 100–199)
HDL: 43 mg/dL (ref >39)
LDL Chol Calc (NIH): 77 mg/dL (ref 0–99)
Triglycerides: 100 mg/dL (ref 0–149)
VLDL Cholesterol Cal: 19 mg/dL (ref 5–40)

## 2023-03-23 LAB — CBC
Hematocrit: 39.2 % (ref 34.0–46.6)
Hemoglobin: 12.9 g/dL (ref 11.1–15.9)
MCH: 30.7 pg (ref 26.6–33.0)
MCHC: 32.9 g/dL (ref 31.5–35.7)
MCV: 93 fL (ref 79–97)
Platelets: 373 10*3/uL (ref 150–450)
RBC: 4.2 x10E6/uL (ref 3.77–5.28)
RDW: 12.6 % (ref 11.7–15.4)
WBC: 7.4 10*3/uL (ref 3.4–10.8)

## 2023-03-23 LAB — TSH+FREE T4
Free T4: 1.04 ng/dL (ref 0.82–1.77)
TSH: 3.95 u[IU]/mL (ref 0.450–4.500)

## 2023-03-23 LAB — HEMOGLOBIN A1C
Est. average glucose Bld gHb Est-mCnc: 103 mg/dL
Hgb A1c MFr Bld: 5.2 % (ref 4.8–5.6)

## 2023-03-23 LAB — VITAMIN D 25 HYDROXY (VIT D DEFICIENCY, FRACTURES): Vit D, 25-Hydroxy: 26.1 ng/mL — ABNORMAL LOW (ref 30.0–100.0)

## 2023-03-23 NOTE — Progress Notes (Signed)
Established patient visit   Patient: Kaylee Rodgers   DOB: 08-08-1986   37 y.o. Female  MRN: [id number] Visit Date: 03/25/2023   Chief Complaint  Patient presents with   Medical Management of Chronic Issues   Subjective    HPI  Follow up -biometric screening for employer.  -recently had routine, fasting labs done for this visit.  --mild vitamin d deficiency --all other labs normal.  -will need biometric form completed and returned to her today . -Continues to get migraine headaches. -- Able to take Bernita Raisin when needed which relieves her migraine headaches. -No new concerns or complaints. -She denies chest pain, chest pressure, or shortness of breath. She denies headaches or visual disturbances. She denies abdominal pain, nausea, vomiting, or changes in bowel or bladder habits.     Medications: Outpatient Medications Prior to Visit  Medication Sig   Cholecalciferol (VITAMIN D3) 1000 units CAPS Take by mouth.   traZODone (DESYREL) 50 MG tablet Take 0.5-1 tablets (25-50 mg total) by mouth at bedtime as needed for sleep.   [DISCONTINUED] Ubrogepant (UBRELVY) 100 MG TABS Take 100 mg by mouth daily as needed.   [DISCONTINUED] phentermine (ADIPEX-P) 37.5 MG tablet Take 1 tablet (37.5 mg total) by mouth daily before breakfast.   No facility-administered medications prior to visit.    Review of Systems See HPI    Last CBC Lab Results  Component Value Date   WBC 7.4 03/22/2023   HGB 12.9 03/22/2023   HCT 39.2 03/22/2023   MCV 93 03/22/2023   MCH 30.7 03/22/2023   RDW 12.6 03/22/2023   PLT 373 03/22/2023   Last metabolic panel Lab Results  Component Value Date   GLUCOSE 90 03/22/2023   NA 140 03/22/2023   K 4.6 03/22/2023   CL 105 03/22/2023   CO2 21 03/22/2023   BUN 11 03/22/2023   CREATININE 0.85 03/22/2023   EGFR 90 03/22/2023   CALCIUM 8.9 03/22/2023   PROT 6.6 03/22/2023   ALBUMIN 4.3 03/22/2023   LABGLOB 2.3 03/22/2023   AGRATIO 1.9 03/22/2023    BILITOT 0.3 03/22/2023   ALKPHOS 87 03/22/2023   AST 10 03/22/2023   ALT 10 03/22/2023   ANIONGAP 6 (L) 09/20/2014   Last lipids Lab Results  Component Value Date   CHOL 139 03/22/2023   HDL 43 03/22/2023   LDLCALC 77 03/22/2023   TRIG 100 03/22/2023   CHOLHDL 3.2 03/22/2023   Last hemoglobin A1c Lab Results  Component Value Date   HGBA1C 5.2 03/22/2023   Last thyroid functions Lab Results  Component Value Date   TSH 3.950 03/22/2023   Last vitamin D Lab Results  Component Value Date   VD25OH 26.1 (L) 03/22/2023       Objective     Today's Vitals   03/25/23 1059  BP: 122/85  Pulse: 63  SpO2: 100%  Weight: 267 lb 6.4 oz (121.3 kg)  Height: 6' (1.829 m)   Body mass index is 36.27 kg/m.  BP Readings from Last 3 Encounters:  03/25/23 122/85  09/13/22 131/86  04/24/22 121/77    Wt Readings from Last 3 Encounters:  03/25/23 267 lb 6.4 oz (121.3 kg)  09/13/22 267 lb 6.4 oz (121.3 kg)  04/24/22 255 lb (115.7 kg)    Physical Exam Vitals and nursing note reviewed.  Constitutional:      Appearance: Normal appearance. She is well-developed.  HENT:     Head: Normocephalic and atraumatic.     Nose: Nose normal.  Mouth/Throat:     Mouth: Mucous membranes are moist.     Pharynx: Oropharynx is clear.  Eyes:     Extraocular Movements: Extraocular movements intact.     Conjunctiva/sclera: Conjunctivae normal.     Pupils: Pupils are equal, round, and reactive to light.  Neck:     Vascular: No carotid bruit.  Cardiovascular:     Rate and Rhythm: Normal rate and regular rhythm.     Pulses: Normal pulses.     Heart sounds: Normal heart sounds.  Pulmonary:     Effort: Pulmonary effort is normal.     Breath sounds: Normal breath sounds.  Abdominal:     Palpations: Abdomen is soft.  Musculoskeletal:        General: Normal range of motion.     Cervical back: Normal range of motion and neck supple.  Lymphadenopathy:     Cervical: No cervical adenopathy.   Skin:    General: Skin is warm and dry.     Capillary Refill: Capillary refill takes less than 2 seconds.  Neurological:     General: No focal deficit present.     Mental Status: She is alert and oriented to person, place, and time.  Psychiatric:        Mood and Affect: Mood normal.        Behavior: Behavior normal.        Thought Content: Thought content normal.        Judgment: Judgment normal.      Assessment & Plan    Migraine without aura and without status migrainosus, not intractable Assessment & Plan: May take Ubrelvy 100 mg as needed for headache. -New prescription sent to pharmacy today.  Orders: Bernita Raisin; Take 1 tablet (100 mg total) by mouth daily as needed.  Dispense: 10 tablet; Refill: 3  Vitamin D deficiency Assessment & Plan: Mild vitamin D deficiency lab work. -Recommend she take 2000 to 5000 IU vitamin D daily. -Recheck in 1 year.   BMI 36.0-36.9,adult Assessment & Plan: Discussed lowering calorie intake to 1500 calories per day and incorporating exercise into daily routine to help lose weight.       Return in about 4 months (around 07/25/2023) for health maintenance exam, with pap.         Carlean Jews, NP  [facility] at [facility] [phone] (phone) [phone] (fax)  [facility]

## 2023-03-25 ENCOUNTER — Encounter: Payer: Self-pay | Admitting: Nurse Practitioner

## 2023-03-25 ENCOUNTER — Ambulatory Visit: Payer: Managed Care, Other (non HMO) | Admitting: Nurse Practitioner

## 2023-03-25 VITALS — BP 122/85 | HR 63 | Ht 72.0 in | Wt 267.4 lb

## 2023-03-25 DIAGNOSIS — G43009 Migraine without aura, not intractable, without status migrainosus: Secondary | ICD-10-CM

## 2023-03-25 DIAGNOSIS — Z6836 Body mass index (BMI) 36.0-36.9, adult: Secondary | ICD-10-CM

## 2023-03-25 DIAGNOSIS — E559 Vitamin D deficiency, unspecified: Secondary | ICD-10-CM

## 2023-03-25 MED ORDER — UBRELVY 100 MG PO TABS
100.0000 mg | ORAL_TABLET | Freq: Every day | ORAL | 3 refills | Status: DC | PRN
Start: 2023-03-25 — End: 2023-07-25

## 2023-04-07 NOTE — Assessment & Plan Note (Signed)
Discussed lowering calorie intake to 1500 calories per day and incorporating exercise into daily routine to help lose weight.  °

## 2023-04-07 NOTE — Assessment & Plan Note (Signed)
Mild vitamin D deficiency lab work. -Recommend she take 2000 to 5000 IU vitamin D daily. -Recheck in 1 year.

## 2023-04-07 NOTE — Assessment & Plan Note (Signed)
May take Ubrelvy 100 mg as needed for headache. -New prescription sent to pharmacy today.

## 2023-05-09 ENCOUNTER — Ambulatory Visit: Payer: Managed Care, Other (non HMO) | Admitting: Family Medicine

## 2023-05-09 ENCOUNTER — Encounter: Payer: Self-pay | Admitting: Family Medicine

## 2023-05-09 VITALS — BP 129/88 | HR 67 | Resp 18 | Ht 72.0 in | Wt 274.0 lb

## 2023-05-09 DIAGNOSIS — F320 Major depressive disorder, single episode, mild: Secondary | ICD-10-CM | POA: Diagnosis not present

## 2023-05-09 DIAGNOSIS — R41840 Attention and concentration deficit: Secondary | ICD-10-CM | POA: Insufficient documentation

## 2023-05-09 DIAGNOSIS — F411 Generalized anxiety disorder: Secondary | ICD-10-CM | POA: Diagnosis not present

## 2023-05-09 DIAGNOSIS — E559 Vitamin D deficiency, unspecified: Secondary | ICD-10-CM | POA: Diagnosis not present

## 2023-05-09 MED ORDER — VITAMIN D (ERGOCALCIFEROL) 1.25 MG (50000 UNIT) PO CAPS
50000.0000 [IU] | ORAL_CAPSULE | ORAL | 0 refills | Status: DC
Start: 2023-05-09 — End: 2023-05-14

## 2023-05-09 MED ORDER — BUPROPION HCL ER (SR) 150 MG PO TB12
150.0000 mg | ORAL_TABLET | ORAL | 1 refills | Status: DC
Start: 2023-05-09 — End: 2023-07-25

## 2023-05-09 NOTE — Progress Notes (Signed)
Acute Office Visit  Subjective:     Patient ID: Kaylee Rodgers, female    DOB: [birth year], 37 y.o.   MRN: [id number]  Chief Complaint  Patient presents with   Anxiety    HPI Patient is in today for anxiety and difficulty concentrating. Anxiety: Patient complains of  anxiety .  She has the following symptoms: difficulty concentrating, racing thoughts. Onset of symptoms was many years ago, and has waxed and waned at different stages of her life since that time. She has taken anxiety medication in the past including Paxil which was horrible as well as as needed hydroxyzine which made her incredibly sleepy.  She also has concerns for potential ADHD.  She has struggled to focus/concentrate since childhood.  It is now at the point where it is affecting her ability to perform her job responsibilities.  She has never been formally evaluated for ADHD and has never tried any medication for it.  Another provider recently prescribed atomoxetine, but she did not try it after reading the side effects.  Her mother has a history of DVT and she was concerned about an increased risk of blood clots.    05/09/2023    8:26 AM 03/25/2023   11:04 AM 04/24/2022    3:21 PM  Depression screen PHQ 2/9  Decreased Interest 0 0 0  Down, Depressed, Hopeless 1 0 0  PHQ - 2 Score 1 0 0  Altered sleeping 0 0 0  Tired, decreased energy 2 0 0  Change in appetite 1 1 1   Feeling bad or failure about yourself  1 1 0  Trouble concentrating 2 1 1   Moving slowly or fidgety/restless 0 0 0  Suicidal thoughts 0 0 0  PHQ-9 Score 7 3 2   Difficult doing work/chores Somewhat difficult Not difficult at all       05/09/2023    8:26 AM 04/24/2022    3:22 PM 03/27/2022    8:42 AM 02/19/2022   10:35 AM  GAD 7 : Generalized Anxiety Score  Nervous, Anxious, on Edge 1 0 0 1  Control/stop worrying 1 0 0 0  Worry too much - different things 1 0 0 0  Trouble relaxing 0 0 0 0  Restless 0 0 0 0  Easily annoyed or irritable 1 0 0 0   Afraid - awful might happen 0 0 0 0  Total GAD 7 Score 4 0 0 1  Anxiety Difficulty Somewhat difficult      ROS Negative unless otherwise noted in HPI    Objective:    BP 129/88 (BP Location: Left Arm, Patient Position: Sitting, Cuff Size: Large)   Pulse 67   Resp 18   Ht 6' (1.829 m)   Wt 274 lb (124.3 kg)   SpO2 100%   BMI 37.16 kg/m   Physical Exam Constitutional:      General: She is not in acute distress.    Appearance: Normal appearance.  HENT:     Head: Normocephalic and atraumatic.  Pulmonary:     Effort: Pulmonary effort is normal. No respiratory distress.  Musculoskeletal:     Cervical back: Normal range of motion.  Neurological:     General: No focal deficit present.     Mental Status: She is alert and oriented to person, place, and time. Mental status is at baseline.  Psychiatric:        Mood and Affect: Mood normal.        Thought Content: Thought content normal.  Judgment: Judgment normal.       Assessment & Plan:  Difficulty concentrating -     buPROPion HCl ER (SR); Take 1 tablet (150 mg total) by mouth every morning.  Dispense: 90 tablet; Refill: 1 -     Ambulatory referral to Psychiatry  Current mild episode of major depressive disorder without prior episode (HCC) -     buPROPion HCl ER (SR); Take 1 tablet (150 mg total) by mouth every morning.  Dispense: 90 tablet; Refill: 1  GAD (generalized anxiety disorder)  Vitamin D deficiency -     Vitamin D (Ergocalciferol); Take 1 capsule (50,000 Units total) by mouth every 7 (seven) days.  Dispense: 8 capsule; Refill: 0  On last check, lab work including CBC, CMP, lipid panel, TSH, free T4 was within normal limits.  Vitamin D was low, so we will address this with 8 weeks of prescription strength vitamin D before returning to an over-the-counter supplement to maintain levels.  We discussed that it can be difficult discerning what the cause of difficulty concentrating may be.  Options would be a  trial of an SSRI or SNRI which would be helpful for managing anxiety and potentially prove concentration.  Another option is starting bupropion which can be helpful for concentration and even ADHD in particular.  PHQ-9 score of 7 positive for depression, GAD-7 score 4 positive for anxiety.  Bupropion would also be helpful for feelings of depression.  We will follow-up during her appointment scheduled in October to assess efficacy and discuss next steps.  She is also agreeable to a referral for formal ADHD evaluation to rule that in or out.  Return in about 11 weeks (around 07/25/2023) for annual physical + pap.  Melida Quitter, PA

## 2023-05-14 ENCOUNTER — Encounter: Payer: Self-pay | Admitting: Family Medicine

## 2023-05-14 DIAGNOSIS — E559 Vitamin D deficiency, unspecified: Secondary | ICD-10-CM

## 2023-05-14 MED ORDER — VITAMIN D (ERGOCALCIFEROL) 1.25 MG (50000 UNIT) PO CAPS
50000.0000 [IU] | ORAL_CAPSULE | ORAL | 0 refills | Status: DC
Start: 2023-05-14 — End: 2023-07-25

## 2023-05-14 NOTE — Telephone Encounter (Signed)
It's taken weekly so they can do 12 weeks, which is an 84 day supply.  But 12 weeks will be fine.

## 2023-07-16 ENCOUNTER — Other Ambulatory Visit: Payer: Self-pay | Admitting: Family Medicine

## 2023-07-16 ENCOUNTER — Encounter: Payer: Self-pay | Admitting: Family Medicine

## 2023-07-16 DIAGNOSIS — E559 Vitamin D deficiency, unspecified: Secondary | ICD-10-CM

## 2023-07-16 NOTE — Progress Notes (Signed)
Order for Vitamin D has been released so that patient can have drawn at remote [facility].

## 2023-07-16 NOTE — Addendum Note (Signed)
Addended by: Tonny Bollman on: 07/16/2023 08:16 AM   Modules accepted: Orders

## 2023-07-20 LAB — VITAMIN D 25 HYDROXY (VIT D DEFICIENCY, FRACTURES): Vit D, 25-Hydroxy: 45.9 ng/mL (ref 30.0–100.0)

## 2023-07-25 ENCOUNTER — Ambulatory Visit (INDEPENDENT_AMBULATORY_CARE_PROVIDER_SITE_OTHER): Payer: Managed Care, Other (non HMO) | Admitting: Family Medicine

## 2023-07-25 ENCOUNTER — Encounter: Payer: Self-pay | Admitting: Family Medicine

## 2023-07-25 VITALS — BP 136/84 | HR 63 | Resp 18 | Ht 72.0 in | Wt 273.0 lb

## 2023-07-25 DIAGNOSIS — F320 Major depressive disorder, single episode, mild: Secondary | ICD-10-CM | POA: Diagnosis not present

## 2023-07-25 DIAGNOSIS — Z0000 Encounter for general adult medical examination without abnormal findings: Secondary | ICD-10-CM

## 2023-07-25 DIAGNOSIS — E559 Vitamin D deficiency, unspecified: Secondary | ICD-10-CM

## 2023-07-25 DIAGNOSIS — G43009 Migraine without aura, not intractable, without status migrainosus: Secondary | ICD-10-CM

## 2023-07-25 DIAGNOSIS — Z1159 Encounter for screening for other viral diseases: Secondary | ICD-10-CM

## 2023-07-25 DIAGNOSIS — F411 Generalized anxiety disorder: Secondary | ICD-10-CM | POA: Diagnosis not present

## 2023-07-25 DIAGNOSIS — R030 Elevated blood-pressure reading, without diagnosis of hypertension: Secondary | ICD-10-CM | POA: Insufficient documentation

## 2023-07-25 MED ORDER — HYDROCHLOROTHIAZIDE 12.5 MG PO TABS
12.5000 mg | ORAL_TABLET | Freq: Every day | ORAL | 3 refills | Status: DC
Start: 2023-07-25 — End: 2023-09-04

## 2023-07-25 MED ORDER — DESVENLAFAXINE SUCCINATE ER 25 MG PO TB24
25.0000 mg | ORAL_TABLET | Freq: Every day | ORAL | 2 refills | Status: DC
Start: 2023-07-25 — End: 2023-09-04

## 2023-07-25 NOTE — Assessment & Plan Note (Signed)
Vitamin D within normal limits.  Continue multivitamin containing vitamin D.  Recheck at annual physical next year.

## 2023-07-25 NOTE — Progress Notes (Signed)
Complete physical exam  Patient: Kaylee Rodgers   DOB: Dec 24, 1985   37 y.o. Female  MRN: [id number]  Subjective:    Chief Complaint  Patient presents with   Annual Exam    Kaylee Rodgers is a 37 y.o. female who presents today for a complete physical exam. She reports consuming a general diet. She generally feels well but is very stressed.  Wellbutrin can improve her focus, but she could not drink any alcohol while taking it.  She also noticed that if she did have a THC gummy, it would counteract the Wellbutrin and she would have to "start over" taking about a week before the Wellbutrin helped with focus again.  She has also noticed that her blood pressure has been staying slightly high at visits over the past few office visits.  She has had an increase in swelling in her lower legs over the past few months as well.   Most recent fall risk assessment:    03/25/2023   11:04 AM  Fall Risk   Falls in the past year? 0  Number falls in past yr: 0  Injury with Fall? 0  Risk for fall due to : No Fall Risks  Follow up Falls evaluation completed     Most recent depression and anxiety screenings:    07/25/2023    9:40 AM 05/09/2023    8:26 AM  PHQ 2/9 Scores  PHQ - 2 Score 1 1  PHQ- 9 Score 4 7      07/25/2023    9:40 AM 05/09/2023    8:26 AM 04/24/2022    3:22 PM 03/27/2022    8:42 AM  GAD 7 : Generalized Anxiety Score  Nervous, Anxious, on Edge 1 1 0 0  Control/stop worrying 1 1 0 0  Worry too much - different things 0 1 0 0  Trouble relaxing 0 0 0 0  Restless 1 0 0 0  Easily annoyed or irritable 0 1 0 0  Afraid - awful might happen 0 0 0 0  Total GAD 7 Score 3 4 0 0  Anxiety Difficulty Somewhat difficult Somewhat difficult      Patient Active Problem List   Diagnosis Date Noted   Elevated blood pressure reading 07/25/2023   Current mild episode of major depressive disorder without prior episode (HCC) 05/09/2023   Difficulty concentrating 05/09/2023   GAD  (generalized anxiety disorder) 05/09/2023   Localized primary osteoarthritis of left hand 03/16/2021   Vitamin D deficiency 01/22/2021   Migraine without aura and without status migrainosus, not intractable 09/11/2020   BMI 34.0-34.9,adult 06/18/2018    Past Surgical History:  Procedure Laterality Date   TONSILLECTOMY     Social History   Tobacco Use   Smoking status: Former    Current packs/day: 0.00    Types: Cigarettes    Quit date: 11/12/2005    Years since quitting: 17.7    Passive exposure: Never   Smokeless tobacco: Never  Vaping Use   Vaping status: Never Used  Substance Use Topics   Alcohol use: Yes    Comment: socially   Drug use: Never   Family History  Problem Relation Age of Onset   Arthritis/Rheumatoid Mother    Diabetes Mother    Hypertension Father    Alcoholism Father    High blood pressure Father    Diabetes Maternal Grandmother    Cancer Paternal Grandmother    High Cholesterol Paternal Grandmother    High blood pressure  Paternal Grandfather    No Known Allergies   Patient Care Team: Melida Quitter, PA as PCP - General (Family Medicine)   Outpatient Medications Prior to Visit  Medication Sig   Cholecalciferol (VITAMIN D3) 1000 units CAPS Take by mouth.   traZODone (DESYREL) 50 MG tablet Take 0.5-1 tablets (25-50 mg total) by mouth at bedtime as needed for sleep.   [DISCONTINUED] atomoxetine (STRATTERA) 25 MG capsule Take 25 mg by mouth daily.   [DISCONTINUED] buPROPion (WELLBUTRIN SR) 150 MG 12 hr tablet Take 1 tablet (150 mg total) by mouth every morning.   [DISCONTINUED] Ubrogepant (UBRELVY) 100 MG TABS Take 1 tablet (100 mg total) by mouth daily as needed.   [DISCONTINUED] Vitamin D, Ergocalciferol, (DRISDOL) 1.25 MG (50000 UNIT) CAPS capsule Take 1 capsule (50,000 Units total) by mouth every 7 (seven) days.   No facility-administered medications prior to visit.    Review of Systems  Constitutional:  Negative for chills, fever and  malaise/fatigue.  HENT:  Negative for congestion and hearing loss.   Eyes:  Negative for blurred vision and double vision.  Respiratory:  Negative for cough and shortness of breath.   Cardiovascular:  Negative for chest pain, palpitations and leg swelling.  Gastrointestinal:  Negative for abdominal pain, constipation, diarrhea and heartburn.  Genitourinary:  Negative for frequency and urgency.  Musculoskeletal:  Negative for myalgias and neck pain.       Bilateral lower leg edema  Neurological:  Negative for headaches.  Endo/Heme/Allergies:  Negative for polydipsia.  Psychiatric/Behavioral:  Negative for depression. The patient is nervous/anxious.       Objective:    BP 136/84 (BP Location: Left Arm, Patient Position: Sitting, Cuff Size: Normal)   Pulse 63   Resp 18   Ht 6' (1.829 m)   Wt 273 lb (123.8 kg)   LMP 07/09/2023 (Approximate)   SpO2 100%   BMI 37.03 kg/m    Physical Exam Constitutional:      General: She is not in acute distress.    Appearance: Normal appearance.  HENT:     Head: Normocephalic and atraumatic.     Right Ear: Tympanic membrane, ear canal and external ear normal.     Left Ear: Tympanic membrane, ear canal and external ear normal.     Nose: Nose normal.     Mouth/Throat:     Mouth: Mucous membranes are moist.     Pharynx: No oropharyngeal exudate or posterior oropharyngeal erythema.  Eyes:     Extraocular Movements: Extraocular movements intact.     Conjunctiva/sclera: Conjunctivae normal.     Pupils: Pupils are equal, round, and reactive to light.     Comments: Wearing glasses, prescription up-to-date  Neck:     Thyroid: No thyroid mass, thyromegaly or thyroid tenderness.  Cardiovascular:     Rate and Rhythm: Normal rate and regular rhythm.     Heart sounds: Normal heart sounds. No murmur heard.    No friction rub. No gallop.  Pulmonary:     Effort: Pulmonary effort is normal. No respiratory distress.     Breath sounds: Normal breath  sounds. No wheezing, rhonchi or rales.  Abdominal:     General: Abdomen is flat. Bowel sounds are normal. There is no distension.     Palpations: There is no mass.     Tenderness: There is no abdominal tenderness. There is no guarding.  Musculoskeletal:        General: Swelling (Bilateral lower leg swelling, nonpitting) present. No tenderness. Normal  range of motion.     Cervical back: Normal range of motion and neck supple.  Lymphadenopathy:     Cervical: No cervical adenopathy.  Skin:    General: Skin is warm and dry.  Neurological:     Mental Status: She is alert and oriented to person, place, and time.     Cranial Nerves: No cranial nerve deficit.     Motor: No weakness.     Deep Tendon Reflexes: Reflexes normal.  Psychiatric:        Mood and Affect: Mood normal.       Assessment & Plan:    Routine Health Maintenance and Physical Exam  Immunization History  Administered Date(s) Administered   Influenza Inj Mdck Quad Pf 06/12/2018   Influenza,inj,Quad PF,6+ Mos 08/01/2021, 09/13/2022   Influenza-Unspecified 06/09/2023   PFIZER(Purple Rodgers)SARS-COV-2 Vaccination 12/19/2019, 01/12/2020, 10/07/2020   Rabies, IM 10/12/2019, 10/15/2019, 10/19/2019, 10/26/2019    Health Maintenance  Topic Date Due   HIV Screening  Never done   Hepatitis C Screening  Never done   DTaP/Tdap/Td (1 - Tdap) Never done   COVID-19 Vaccine (4 - 2023-24 season) 08/10/2023 (Originally 06/09/2023)   Cervical Cancer Screening (HPV/Pap Cotest)  06/15/2024   INFLUENZA VACCINE  Completed   HPV VACCINES  Aged Out    Reviewed most recent labs including CBC, CMP, lipid panel, A1C, TSH, and vitamin D. All within normal limits/stable from last check. Discussed recommendations for Tdap, patient declines due to arm soreness after receiving most recent tetanus booster.  Discussed health benefits of physical activity, and encouraged her to engage in regular exercise appropriate for her age and  condition.  Wellness examination  Current mild episode of major depressive disorder without prior episode [facility]) Assessment & Plan: Start desvenlafaxine 25 mg daily.  Follow-up in about 6 weeks.   GAD (generalized anxiety disorder) Assessment & Plan: Start desvenlafaxine 25 mg daily for mood.  Discussed mechanism of action, common side effects.  Patient agreeable to trial.  Previously has tried and failed Paxil.  Will closely monitor blood pressure and heart rate due to norepinephrine action of desvenlafaxine.  Orders: -     Desvenlafaxine Succinate ER; Take 1 tablet (25 mg total) by mouth daily.  Dispense: 30 tablet; Refill: 2  Vitamin D deficiency Assessment & Plan: Vitamin D within normal limits.  Continue multivitamin containing vitamin D.  Recheck at annual physical next year.   Migraine without aura and without status migrainosus, not intractable Assessment & Plan: Insurance will not cover Ubrelvy.  Continue over-the-counter methods if tolerable, otherwise discuss other medication options.   Elevated blood pressure reading Assessment & Plan: Given elevated blood pressure reading in combination with lower leg edema, start low-dose of hydrochlorothiazide 12.5 mg daily.  Follow-up and recheck labs in about 6 weeks.  Orders: -     hydroCHLOROthiazide; Take 1 tablet (12.5 mg total) by mouth daily.  Dispense: 30 tablet; Refill: 3 -     CBC with Differential/Platelet; Future -     Comprehensive metabolic panel; Future  Screening for viral disease -     Hepatitis C antibody; Future -     HIV Antibody (routine testing w rflx); Future    Return in about 6 weeks (around 09/05/2023) for follow-up for blood pressure/swelling and stress, nonfasting labs same day or early if preferred.     Melida Quitter, PA

## 2023-07-25 NOTE — Assessment & Plan Note (Signed)
Start desvenlafaxine 25 mg daily.  Follow-up in about 6 weeks.

## 2023-07-25 NOTE — Assessment & Plan Note (Signed)
Given elevated blood pressure reading in combination with lower leg edema, start low-dose of hydrochlorothiazide 12.5 mg daily.  Follow-up and recheck labs in about 6 weeks.

## 2023-07-25 NOTE — Assessment & Plan Note (Signed)
Insurance will not cover Ubrelvy.  Continue over-the-counter methods if tolerable, otherwise discuss other medication options.

## 2023-07-25 NOTE — Assessment & Plan Note (Signed)
Start desvenlafaxine 25 mg daily for mood.  Discussed mechanism of action, common side effects.  Patient agreeable to trial.  Previously has tried and failed Paxil.  Will closely monitor blood pressure and heart rate due to norepinephrine action of desvenlafaxine.

## 2023-07-25 NOTE — Patient Instructions (Addendum)
Start taking the hydrochlorothiazide daily for blood pressure and swelling.

## 2023-09-04 ENCOUNTER — Ambulatory Visit: Payer: Managed Care, Other (non HMO) | Admitting: Family Medicine

## 2023-09-04 ENCOUNTER — Encounter: Payer: Self-pay | Admitting: Family Medicine

## 2023-09-04 VITALS — BP 127/85 | HR 71 | Ht 72.0 in | Wt 267.4 lb

## 2023-09-04 DIAGNOSIS — R030 Elevated blood-pressure reading, without diagnosis of hypertension: Secondary | ICD-10-CM | POA: Diagnosis not present

## 2023-09-04 DIAGNOSIS — J302 Other seasonal allergic rhinitis: Secondary | ICD-10-CM | POA: Insufficient documentation

## 2023-09-04 DIAGNOSIS — F411 Generalized anxiety disorder: Secondary | ICD-10-CM | POA: Diagnosis not present

## 2023-09-04 DIAGNOSIS — F320 Major depressive disorder, single episode, mild: Secondary | ICD-10-CM

## 2023-09-04 MED ORDER — DESVENLAFAXINE SUCCINATE ER 50 MG PO TB24
50.0000 mg | ORAL_TABLET | Freq: Every day | ORAL | 3 refills | Status: DC
Start: 2023-09-04 — End: 2024-03-26

## 2023-09-04 MED ORDER — HYDROCHLOROTHIAZIDE 12.5 MG PO TABS: 12.5000 mg | ORAL_TABLET | Freq: Every day | ORAL | 3 refills | Status: DC

## 2023-09-04 NOTE — Patient Instructions (Signed)
It was good to see you today!  As discussed continue hydrochlorothiazide at current dose. I would advise you to continue taking your BP at least weekly to maintain record of the trend.  We have increased your desvenlafaxine, continue to monitor for any adverse side effects and let us know if anything is bothering you!  As discussed for seasonal allergies, you are free to continue your restart your OTC Zyrtec and contact us if your symptoms remain alleviated.

## 2023-09-04 NOTE — Assessment & Plan Note (Addendum)
PHQ-9 score 3, PHQ 2 score 0 which has improved.  Increased desvenlafaxine to 50 mg daily.  Will continue to monitor.

## 2023-09-04 NOTE — Progress Notes (Signed)
Established Patient Office Visit  Subjective   Patient ID: Kaylee Rodgers, female    DOB: 04-Mar-1986  Age: 37 y.o. MRN: [id number]  Chief Complaint  Patient presents with   Medical Management of Chronic Issues    HPI Kaylee Rodgers is a 37 y.o. female presenting today for follow up of blood pressure and stress.  At her annual physical appointment on 07/25/2023 she noted that her blood pressure had been slightly elevated for several office visits and she had noticed an increase in swelling of her lower legs over several months.   Started hydrochlorothiazide 12.5 mg daily at last appointment. BP is much better today at 127/85. Patient notes that her bilateral lower leg swelling has been alleviated since starting the medication. Reports that she does try to elevate her legs towards the end of the day since she is generally seated at a computer for work. Patient has noted increase in urination but is not bothered at this time. No adverse SE noted.  Due to increase in stress and anxiety, also started desvenlafaxine 25 mg daily after previously trying and failing Paxil. Patient notes that her anxiety was initially better, but that over time the effectiveness has been waning. Patient feels that she is having to self medicate with THC gummies more often. Discussed increasing medication to further control anxiety, patient is agreeable to increase in medication. No adverse SE noted.  Patient notes that she has recently had a flare up of seasonal allergies, for which she has a history of. Patient notes that she has zyrtec at home that she will begin to take for treatment. No other problems noted.     09/04/2023    1:45 PM 07/25/2023    9:40 AM 05/09/2023    8:26 AM  Depression screen PHQ 2/9  Decreased Interest 0 1 0  Down, Depressed, Hopeless 0 0 1  PHQ - 2 Score 0 1 1  Altered sleeping 1 0 0  Tired, decreased energy 0 1 2  Change in appetite 0 1 1  Feeling bad or failure about  yourself  1 0 1  Trouble concentrating 1 1 2   Moving slowly or fidgety/restless 0 0 0  Suicidal thoughts 0 0 0  PHQ-9 Score 3 4 7   Difficult doing work/chores Not difficult at all Somewhat difficult Somewhat difficult      07/25/2023    9:40 AM 05/09/2023    8:26 AM 04/24/2022    3:22 PM 03/27/2022    8:42 AM  GAD 7 : Generalized Anxiety Score  Nervous, Anxious, on Edge 1 1 0 0  Control/stop worrying 1 1 0 0  Worry too much - different things 0 1 0 0  Trouble relaxing 0 0 0 0  Restless 1 0 0 0  Easily annoyed or irritable 0 1 0 0  Afraid - awful might happen 0 0 0 0  Total GAD 7 Score 3 4 0 0  Anxiety Difficulty Somewhat difficult Somewhat difficult        Outpatient Medications Prior to Visit  Medication Sig   traZODone (DESYREL) 50 MG tablet Take 0.5-1 tablets (25-50 mg total) by mouth at bedtime as needed for sleep.   [DISCONTINUED] desvenlafaxine (PRISTIQ) 25 MG 24 hr tablet Take 1 tablet (25 mg total) by mouth daily.   [DISCONTINUED] hydrochlorothiazide (HYDRODIURIL) 12.5 MG tablet Take 1 tablet (12.5 mg total) by mouth daily.   [DISCONTINUED] Cholecalciferol (VITAMIN D3) 1000 units CAPS Take by mouth.   No facility-administered  medications prior to visit.    ROS Negative unless otherwise noted in HPI   Objective:     BP 127/85   Pulse 71   Ht 6' (1.829 m)   Wt 267 lb 6.4 oz (121.3 kg)   LMP 09/04/2023   SpO2 99%   BMI 36.27 kg/m   Physical Exam Constitutional:      Appearance: Normal appearance.  HENT:     Head: Normocephalic and atraumatic.     Nose: Congestion (assoc with seasonal allergies) present.  Cardiovascular:     Rate and Rhythm: Normal rate and regular rhythm.     Heart sounds: Normal heart sounds.  Pulmonary:     Effort: Pulmonary effort is normal.     Breath sounds: Normal breath sounds. No wheezing, rhonchi or rales.  Musculoskeletal:     Right lower leg: Edema (slight edema 1+, non-pitting) present.     Left lower leg: Edema (slight  edema 1+, non-pitting) present.  Neurological:     Mental Status: She is alert.  Psychiatric:        Mood and Affect: Mood normal.        Behavior: Behavior normal.     Assessment & Plan:  Elevated blood pressure reading Assessment & Plan: Continue current therapy and continue to monitor BP readings at home.  Orders: -     Basic metabolic panel; Future -     hydroCHLOROthiazide; Take 1 tablet (12.5 mg total) by mouth daily.  Dispense: 90 tablet; Refill: 3  GAD (generalized anxiety disorder) Assessment & Plan: We have increased your dose to 50mg , contact with any noted side effects.  Orders: -     Basic metabolic panel; Future -     Desvenlafaxine Succinate ER; Take 1 tablet (50 mg total) by mouth daily.  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  Increased desvenlafaxine to 50 mg daily.  Will continue to monitor.  Orders: -     Desvenlafaxine Succinate ER; Take 1 tablet (50 mg total) by mouth daily.  Dispense: 90 tablet; Refill: 3  Seasonal allergies Assessment & Plan: As discussed, may begin OTC Zyrtec at home. Continue to monitor.   She has decided to stay on insurance for the time being and will not be transitioning to self-pay at the beginning of 2025.  Return in about 1 week (around 09/11/2023) for nonfasting labs; 4 months for HTN, mood, follow-up.    Teryl Lucy Rothfuss, PA-C

## 2023-09-04 NOTE — Assessment & Plan Note (Addendum)
We have increased your dose to 50mg , contact with any noted side effects.

## 2023-09-04 NOTE — Assessment & Plan Note (Signed)
As discussed, may begin OTC Zyrtec at home. Continue to monitor.

## 2023-09-04 NOTE — Assessment & Plan Note (Addendum)
Continue current therapy and continue to monitor BP readings at home.

## 2023-09-07 LAB — CBC WITH DIFFERENTIAL/PLATELET
Basophils Absolute: 0.1 10*3/uL (ref 0.0–0.2)
Basos: 1 %
EOS (ABSOLUTE): 0.1 10*3/uL (ref 0.0–0.4)
Eos: 2 %
Hematocrit: 39.7 % (ref 34.0–46.6)
Hemoglobin: 12.9 g/dL (ref 11.1–15.9)
Immature Grans (Abs): 0 10*3/uL (ref 0.0–0.1)
Immature Granulocytes: 0 %
Lymphocytes Absolute: 2.2 10*3/uL (ref 0.7–3.1)
Lymphs: 33 %
MCH: 30.6 pg (ref 26.6–33.0)
MCHC: 32.5 g/dL (ref 31.5–35.7)
MCV: 94 fL (ref 79–97)
Monocytes Absolute: 0.5 10*3/uL (ref 0.1–0.9)
Monocytes: 7 %
Neutrophils Absolute: 3.7 10*3/uL (ref 1.4–7.0)
Neutrophils: 57 %
Platelets: 388 10*3/uL (ref 150–450)
RBC: 4.22 x10E6/uL (ref 3.77–5.28)
RDW: 12.7 % (ref 11.7–15.4)
WBC: 6.6 10*3/uL (ref 3.4–10.8)

## 2023-09-07 LAB — COMPREHENSIVE METABOLIC PANEL
ALT: 8 [IU]/L (ref 0–32)
AST: 14 [IU]/L (ref 0–40)
Albumin: 4.1 g/dL (ref 3.9–4.9)
Alkaline Phosphatase: 89 [IU]/L (ref 44–121)
BUN/Creatinine Ratio: 10 (ref 9–23)
BUN: 8 mg/dL (ref 6–20)
Bilirubin Total: 0.5 mg/dL (ref 0.0–1.2)
CO2: 23 mmol/L (ref 20–29)
Calcium: 9.1 mg/dL (ref 8.7–10.2)
Chloride: 104 mmol/L (ref 96–106)
Creatinine, Ser: 0.77 mg/dL (ref 0.57–1.00)
Globulin, Total: 2.3 g/dL (ref 1.5–4.5)
Glucose: 81 mg/dL (ref 70–99)
Potassium: 4.2 mmol/L (ref 3.5–5.2)
Sodium: 141 mmol/L (ref 134–144)
Total Protein: 6.4 g/dL (ref 6.0–8.5)
eGFR: 101 mL/min/{1.73_m2} (ref >59)

## 2023-09-07 LAB — HEPATITIS C ANTIBODY: Hep C Virus Ab: NONREACTIVE

## 2023-09-07 LAB — HIV ANTIBODY (ROUTINE TESTING W REFLEX): HIV Screen 4th Generation wRfx: NONREACTIVE

## 2023-09-09 ENCOUNTER — Ambulatory Visit: Payer: Managed Care, Other (non HMO) | Admitting: Family Medicine

## 2023-11-14 ENCOUNTER — Encounter: Payer: Self-pay | Admitting: Family Medicine

## 2024-01-02 ENCOUNTER — Ambulatory Visit: Payer: Managed Care, Other (non HMO) | Admitting: Family Medicine

## 2024-03-26 ENCOUNTER — Encounter: Payer: Self-pay | Admitting: Family

## 2024-03-26 ENCOUNTER — Ambulatory Visit: Payer: Self-pay | Admitting: Family

## 2024-03-26 VITALS — BP 112/78 | HR 89 | Ht 72.0 in | Wt 284.6 lb

## 2024-03-26 DIAGNOSIS — E782 Mixed hyperlipidemia: Secondary | ICD-10-CM

## 2024-03-26 DIAGNOSIS — F411 Generalized anxiety disorder: Secondary | ICD-10-CM

## 2024-03-26 DIAGNOSIS — E559 Vitamin D deficiency, unspecified: Secondary | ICD-10-CM

## 2024-03-26 DIAGNOSIS — F320 Major depressive disorder, single episode, mild: Secondary | ICD-10-CM | POA: Diagnosis not present

## 2024-03-26 DIAGNOSIS — R7303 Prediabetes: Secondary | ICD-10-CM

## 2024-03-26 DIAGNOSIS — R5383 Other fatigue: Secondary | ICD-10-CM

## 2024-03-26 DIAGNOSIS — G43009 Migraine without aura, not intractable, without status migrainosus: Secondary | ICD-10-CM | POA: Diagnosis not present

## 2024-03-26 DIAGNOSIS — E538 Deficiency of other specified B group vitamins: Secondary | ICD-10-CM

## 2024-03-26 DIAGNOSIS — Z800 Family history of malignant neoplasm of digestive organs: Secondary | ICD-10-CM

## 2024-03-26 DIAGNOSIS — R030 Elevated blood-pressure reading, without diagnosis of hypertension: Secondary | ICD-10-CM

## 2024-03-26 DIAGNOSIS — Z803 Family history of malignant neoplasm of breast: Secondary | ICD-10-CM

## 2024-03-26 MED ORDER — UBRELVY 100 MG PO TABS: 100.0000 mg | ORAL_TABLET | ORAL | 3 refills | Status: AC | PRN

## 2024-03-26 MED ORDER — DESVENLAFAXINE SUCCINATE ER 50 MG PO TB24: 50.0000 mg | ORAL_TABLET | Freq: Every day | ORAL | 3 refills | Status: AC

## 2024-03-26 MED ORDER — HYDROCHLOROTHIAZIDE 12.5 MG PO TABS: 12.5000 mg | ORAL_TABLET | Freq: Every day | ORAL | 3 refills | Status: AC

## 2024-03-26 NOTE — Assessment & Plan Note (Signed)
 Checking labs today.  Will continue supplements as needed.

## 2024-03-26 NOTE — Assessment & Plan Note (Signed)
 Patient stable.  Well controlled with current therapy.   Continue current meds.

## 2024-03-26 NOTE — Progress Notes (Signed)
 New Patient Office Visit  Subjective  Patient ID: Kaylee Rodgers, female    DOB: 1986/06/15  Age: 38 y.o. MRN: [id number]  CC:  Chief Complaint  Patient presents with   Establish Care    NPE    HPI [name] presents to establish care   Outpatient Encounter Medications as of 03/26/2024  Medication Sig   Ubrogepant  (UBRELVY ) 100 MG TABS Take 1 tablet (100 mg total) by mouth as needed (migraine.  May repeat in 2 hours if needed.  Maximum 2 tablets in 24 hours.).   [DISCONTINUED] desvenlafaxine  (PRISTIQ ) 50 MG 24 hr tablet Take 1 tablet (50 mg total) by mouth daily.   [DISCONTINUED] hydrochlorothiazide  (HYDRODIURIL ) 12.5 MG tablet Take 1 tablet (12.5 mg total) by mouth daily.   desvenlafaxine  (PRISTIQ ) 50 MG 24 hr tablet Take 1 tablet (50 mg total) by mouth daily.   hydrochlorothiazide  (HYDRODIURIL ) 12.5 MG tablet Take 1 tablet (12.5 mg total) by mouth daily.   [DISCONTINUED] fexofenadine (ALLEGRA) 180 MG tablet Take 180 mg by mouth daily.   [DISCONTINUED] traZODone  (DESYREL ) 50 MG tablet Take 0.5-1 tablets (25-50 mg total) by mouth at bedtime as needed for sleep. (Patient not taking: Reported on 03/26/2024)   No facility-administered encounter medications on file as of 03/26/2024.    Past Medical History:  Diagnosis Date   Allergy     Past Surgical History:  Procedure Laterality Date   TONSILLECTOMY      Family History  Problem Relation Age of Onset   Arthritis/Rheumatoid Mother    Diabetes Mother    Hypertension Father    Alcoholism Father    High blood pressure Father    Diabetes Maternal Grandmother    Cancer Paternal Grandmother    High Cholesterol Paternal Grandmother    High blood pressure Paternal Grandfather     Social History   Socioeconomic History   Marital status: Married    Spouse name: Not on file   Number of children: Not on file   Years of education: Not on file   Highest education level: Not on file  Occupational History   Not  on file  Tobacco Use   Smoking status: Former    Current packs/day: 0.00    Types: Cigarettes    Quit date: 11/12/2005    Years since quitting: 18.3    Passive exposure: Never   Smokeless tobacco: Never  Vaping Use   Vaping status: Never Used  Substance and Sexual Activity   Alcohol use: Yes    Comment: socially   Drug use: Never   Sexual activity: Yes    Partners: Female  Other Topics Concern   Not on file  Social History Narrative   Not on file   Social Drivers of Health   Financial Resource Strain: Not on file  Food Insecurity: Not on file  Transportation Needs: Not on file  Physical Activity: Not on file  Stress: Not on file  Social Connections: Not on file  Intimate Partner Violence: Not on file    Review of Systems  All other systems reviewed and are negative.       Objective   BP 112/78   Pulse 89   Ht 6' (1.829 m)   Wt 284 lb 9.6 oz (129.1 kg)   SpO2 98%   BMI 38.60 kg/m   Physical Exam Vitals and nursing note reviewed.  Constitutional:      Appearance: Normal appearance. She is normal weight.  HENT:  Head: Normocephalic and atraumatic.   Eyes:     Extraocular Movements: Extraocular movements intact.     Conjunctiva/sclera: Conjunctivae normal.     Pupils: Pupils are equal, round, and reactive to light.    Cardiovascular:     Rate and Rhythm: Normal rate.  Pulmonary:     Effort: Pulmonary effort is normal.   Musculoskeletal:        General: Normal range of motion.     Cervical back: Normal range of motion.   Neurological:     General: No focal deficit present.     Mental Status: She is alert and oriented to person, place, and time. Mental status is at baseline.   Psychiatric:        Mood and Affect: Mood normal.        Behavior: Behavior normal.        Thought Content: Thought content normal.        Judgment: Judgment normal.        Assessment & Plan:   Assessment & Plan Current mild episode of major depressive disorder  without prior episode (HCC) GAD (generalized anxiety disorder) Patient stable.  Well controlled with current therapy.   Continue current meds.  Migraine without aura and without status migrainosus, not intractable Patient has taken Ubrelvy  in the past and has had relief from this, but says that her insurance requires a lot of paperwork.  Will send RX and work to get PA approved for her. Prediabetes Checking labs today.  Will discuss results at follow up.  Vitamin D  deficiency B12 deficiency due to diet Other fatigue Checking labs today.  Will continue supplements as needed.  Mixed hyperlipidemia Checking labs today.  Continue current therapy for lipid control. Will modify as needed based on labwork results.  Family history of breast cancer Family history- stomach cancer Given strong family history, I would like to do BRCA testing for pt.  Will contact her with results when available.   Return in about 2 weeks (around 04/09/2024).   Total time spent: 30 minutes  Trenda Frisk, FNP  03/26/2024  This document may have been prepared by [facility] Voice Recognition software and as such may include unintentional dictation errors.

## 2024-03-26 NOTE — Assessment & Plan Note (Signed)
 Patient has taken Ubrelvy  in the past and has had relief from this, but says that her insurance requires a lot of paperwork.  Will send RX and work to get PA approved for her.

## 2024-03-27 ENCOUNTER — Ambulatory Visit: Payer: Self-pay

## 2024-03-27 LAB — CBC WITH DIFFERENTIAL/PLATELET
Basophils Absolute: 0 10*3/uL (ref 0.0–0.2)
Basos: 0 %
EOS (ABSOLUTE): 0 10*3/uL (ref 0.0–0.4)
Eos: 0 %
Hematocrit: 38 % (ref 34.0–46.6)
Hemoglobin: 12.1 g/dL (ref 11.1–15.9)
Immature Grans (Abs): 0 10*3/uL (ref 0.0–0.1)
Immature Granulocytes: 0 %
Lymphocytes Absolute: 2.8 10*3/uL (ref 0.7–3.1)
Lymphs: 36 %
MCH: 30 pg (ref 26.6–33.0)
MCHC: 31.8 g/dL (ref 31.5–35.7)
MCV: 94 fL (ref 79–97)
Monocytes Absolute: 0.7 10*3/uL (ref 0.1–0.9)
Monocytes: 8 %
Neutrophils Absolute: 4.3 10*3/uL (ref 1.4–7.0)
Neutrophils: 56 %
Platelets: 384 10*3/uL (ref 150–450)
RBC: 4.04 x10E6/uL (ref 3.77–5.28)
RDW: 13 % (ref 11.7–15.4)
WBC: 7.7 10*3/uL (ref 3.4–10.8)

## 2024-03-27 LAB — CMP14+EGFR
ALT: 11 IU/L (ref 0–32)
AST: 16 IU/L (ref 0–40)
Albumin: 4.3 g/dL (ref 3.9–4.9)
Alkaline Phosphatase: 82 IU/L (ref 44–121)
BUN/Creatinine Ratio: 12 (ref 9–23)
BUN: 9 mg/dL (ref 6–20)
Bilirubin Total: 0.4 mg/dL (ref 0.0–1.2)
CO2: 20 mmol/L (ref 20–29)
Calcium: 9.2 mg/dL (ref 8.7–10.2)
Chloride: 105 mmol/L (ref 96–106)
Creatinine, Ser: 0.76 mg/dL (ref 0.57–1.00)
Globulin, Total: 2.1 g/dL (ref 1.5–4.5)
Glucose: 89 mg/dL (ref 70–99)
Potassium: 4.4 mmol/L (ref 3.5–5.2)
Sodium: 139 mmol/L (ref 134–144)
Total Protein: 6.4 g/dL (ref 6.0–8.5)
eGFR: 102 mL/min/{1.73_m2} (ref >59)

## 2024-03-27 LAB — LIPID PANEL
Chol/HDL Ratio: 3.2 ratio (ref 0.0–4.4)
Cholesterol, Total: 159 mg/dL (ref 100–199)
HDL: 49 mg/dL (ref >39)
LDL Chol Calc (NIH): 95 mg/dL (ref 0–99)
Triglycerides: 77 mg/dL (ref 0–149)
VLDL Cholesterol Cal: 15 mg/dL (ref 5–40)

## 2024-03-27 LAB — HEMOGLOBIN A1C
Est. average glucose Bld gHb Est-mCnc: 103 mg/dL
Hgb A1c MFr Bld: 5.2 % (ref 4.8–5.6)

## 2024-03-27 LAB — TSH: TSH: 3.76 u[IU]/mL (ref 0.450–4.500)

## 2024-03-27 LAB — VITAMIN D 25 HYDROXY (VIT D DEFICIENCY, FRACTURES): Vit D, 25-Hydroxy: 31.9 ng/mL (ref 30.0–100.0)

## 2024-03-27 LAB — VITAMIN B12: Vitamin B-12: 298 pg/mL (ref 232–1245)

## 2024-04-09 ENCOUNTER — Ambulatory Visit: Admitting: Family

## 2024-04-09 ENCOUNTER — Encounter: Payer: Self-pay | Admitting: Family

## 2024-04-09 VITALS — BP 112/82 | HR 72 | Ht 72.0 in | Wt 282.8 lb

## 2024-04-09 DIAGNOSIS — Z0000 Encounter for general adult medical examination without abnormal findings: Secondary | ICD-10-CM | POA: Diagnosis not present

## 2024-04-09 NOTE — Progress Notes (Signed)
 Sleep questionnaire 6/10

## 2024-04-09 NOTE — Progress Notes (Signed)
 Complete physical exam  Patient: Kaylee Rodgers   DOB: April 25, 1986   38 y.o. Female  MRN: [id number]  Subjective:    Chief Complaint  Patient presents with   Annual Exam    Kaylee Rodgers is a 38 y.o. female who presents today for a complete physical exam. She reports consuming a general diet. The patient does not participate in regular exercise at present. She generally feels well. She reports sleeping fairly well. She does not have additional problems to discuss today.     Most recent depression screenings:    03/26/2024    1:17 PM 09/04/2023    1:45 PM  PHQ 2/9 Scores  PHQ - 2 Score 0 0  PHQ- 9 Score 8 3     Past Medical History:  Diagnosis Date   Allergy     Past Surgical History:  Procedure Laterality Date   TONSILLECTOMY      Family History  Problem Relation Age of Onset   Arthritis/Rheumatoid Mother    Diabetes Mother    Hypertension Father    Alcoholism Father    High blood pressure Father    Diabetes Maternal Grandmother    Cancer Paternal Grandmother    High Cholesterol Paternal Grandmother    High blood pressure Paternal Grandfather     Social History   Socioeconomic History   Marital status: Married    Spouse name: Not on file   Number of children: Not on file   Years of education: Not on file   Highest education level: Not on file  Occupational History   Not on file  Tobacco Use   Smoking status: Former    Current packs/day: 0.00    Types: Cigarettes    Quit date: 11/12/2005    Years since quitting: 18.4    Passive exposure: Never   Smokeless tobacco: Never  Vaping Use   Vaping status: Never Used  Substance and Sexual Activity   Alcohol use: Yes    Comment: socially   Drug use: Never   Sexual activity: Yes    Partners: Female  Other Topics Concern   Not on file  Social History Narrative   Not on file   Social Drivers of Health   Financial Resource Strain: Not on file  Food Insecurity: Not on file  Transportation  Needs: Not on file  Physical Activity: Not on file  Stress: Not on file  Social Connections: Not on file  Intimate Partner Violence: Not on file    Outpatient Medications Prior to Visit  Medication Sig   cetirizine (ZYRTEC) 10 MG tablet Take 10 mg by mouth daily.   desvenlafaxine  (PRISTIQ ) 50 MG 24 hr tablet Take 1 tablet (50 mg total) by mouth daily.   hydrochlorothiazide  (HYDRODIURIL ) 12.5 MG tablet Take 1 tablet (12.5 mg total) by mouth daily.   Multiple Vitamin (MULTIVITAMIN) tablet Take 1 tablet by mouth daily.   Ubrogepant  (UBRELVY ) 100 MG TABS Take 1 tablet (100 mg total) by mouth as needed (migraine.  May repeat in 2 hours if needed.  Maximum 2 tablets in 24 hours.).   No facility-administered medications prior to visit.    Review of Systems  All other systems reviewed and are negative.       Objective:     BP 112/82   Pulse 72   Ht 6' (1.829 m)   Wt 282 lb 12.8 oz (128.3 kg)   SpO2 98%   BMI 38.35 kg/m    Physical Exam Vitals  and nursing note reviewed.  Constitutional:      Appearance: Normal appearance. She is normal weight.  HENT:     Head: Normocephalic.  Eyes:     Extraocular Movements: Extraocular movements intact.     Conjunctiva/sclera: Conjunctivae normal.     Pupils: Pupils are equal, round, and reactive to light.  Cardiovascular:     Rate and Rhythm: Normal rate.  Pulmonary:     Effort: Pulmonary effort is normal.  Neurological:     General: No focal deficit present.     Mental Status: She is alert and oriented to person, place, and time. Mental status is at baseline.  Psychiatric:        Mood and Affect: Mood normal.        Behavior: Behavior normal.        Thought Content: Thought content normal.      No results found for any visits on 04/09/24.  Recent Results (from the past 2160 hours)  Lipid panel     Status: None   Collection Time: 03/26/24 10:15 AM  Result Value Ref Range   Cholesterol, Total 159 100 - 199 mg/dL    Triglycerides 77 0 - 149 mg/dL   HDL 49 >60 mg/dL   VLDL Cholesterol Cal 15 5 - 40 mg/dL   LDL Chol Calc (NIH) 95 0 - 99 mg/dL   Chol/HDL Ratio 3.2 0.0 - 4.4 ratio    Comment:                                   T. Chol/HDL Ratio                                             Men  Women                               1/2 Avg.Risk  3.4    3.3                                   Avg.Risk  5.0    4.4                                2X Avg.Risk  9.6    7.1                                3X Avg.Risk 23.4   11.0   VITAMIN D  25 Hydroxy (Vit-D Deficiency, Fractures)     Status: None   Collection Time: 03/26/24 10:15 AM  Result Value Ref Range   Vit D, 25-Hydroxy 31.9 30.0 - 100.0 ng/mL    Comment: Vitamin D  deficiency has been defined by the Institute of Medicine and an Endocrine Society practice guideline as a level of serum 25-OH vitamin D  less than 20 ng/mL (1,2). The Endocrine Society went on to further define vitamin D  insufficiency as a level between 21 and 29 ng/mL (2). 1. IOM (Institute of Medicine). 2010. Dietary reference    intakes for calcium and D. [place]: The    [employer]. 2. Holick MF, Binkley Eland, Bischoff-Ferrari HA, et al.    Evaluation, treatment, and prevention of vitamin D     deficiency: an Endocrine Society clinical practice    guideline. JCEM. 2011 Jul; 96(7):1911-30.   CMP14+EGFR     Status: None   Collection Time: 03/26/24 10:15 AM  Result Value Ref Range   Glucose 89 70 - 99 mg/dL   BUN 9 6 - 20 mg/dL   Creatinine, Ser 9.23 0.57 - 1.00 mg/dL   eGFR 897 >40 fO/fpw/8.26   BUN/Creatinine Ratio 12 9 - 23   Sodium 139 134 - 144 mmol/L   Potassium 4.4 3.5 - 5.2 mmol/L   Chloride 105 96 - 106 mmol/L   CO2 20 20 - 29 mmol/L   Calcium 9.2 8.7 - 10.2 mg/dL   Total Protein 6.4 6.0 - 8.5 g/dL   Albumin 4.3 3.9 - 4.9 g/dL   Globulin, Total 2.1 1.5 - 4.5 g/dL   Bilirubin Total 0.4 0.0 - 1.2 mg/dL   Alkaline Phosphatase 82 44 - 121 IU/L   AST 16 0 - 40  IU/L   ALT 11 0 - 32 IU/L  TSH     Status: None   Collection Time: 03/26/24 10:15 AM  Result Value Ref Range   TSH 3.760 0.450 - 4.500 uIU/mL  Hemoglobin A1c     Status: None   Collection Time: 03/26/24 10:15 AM  Result Value Ref Range   Hgb A1c MFr Bld 5.2 4.8 - 5.6 %    Comment:          Prediabetes: 5.7 - 6.4          Diabetes: >6.4          Glycemic control for adults with diabetes: <7.0    Est. average glucose Bld gHb Est-mCnc 103 mg/dL  Vitamin B12     Status: None   Collection Time: 03/26/24 10:15 AM  Result Value Ref Range   Vitamin B-12 298 232 - 1,245 pg/mL  CBC with Diff     Status: None   Collection Time: 03/26/24 10:15 AM  Result Value Ref Range   WBC 7.7 3.4 - 10.8 x10E3/uL   RBC 4.04 3.77 - 5.28 x10E6/uL   Hemoglobin 12.1 11.1 - 15.9 g/dL   Hematocrit 61.9 65.9 - 46.6 %   MCV 94 79 - 97 fL   MCH 30.0 26.6 - 33.0 pg   MCHC 31.8 31.5 - 35.7 g/dL   RDW 86.9 88.2 - 84.5 %   Platelets 384 150 - 450 x10E3/uL   Neutrophils 56 Not Estab. %   Lymphs 36 Not Estab. %   Monocytes 8 Not Estab. %   Eos 0 Not Estab. %   Basos 0 Not Estab. %   Neutrophils Absolute 4.3 1.4 - 7.0 x10E3/uL   Lymphocytes Absolute 2.8 0.7 - 3.1 x10E3/uL   Monocytes Absolute 0.7 0.1 - 0.9 x10E3/uL   EOS (ABSOLUTE) 0.0 0.0 - 0.4 x10E3/uL   Basophils Absolute 0.0 0.0 - 0.2 x10E3/uL   Immature Granulocytes 0 Not Estab. %   Immature Grans (Abs) 0.0 0.0 - 0.1 x10E3/uL        Assessment & Plan:    Routine Health Maintenance and Physical Exam  Immunization History  Administered Date(s) Administered   Influenza Inj Mdck Quad Pf 06/12/2018   Influenza,inj,Quad PF,6+ Mos 08/01/2021, 09/13/2022   Influenza-Unspecified 06/09/2023   PFIZER(Purple Top)SARS-COV-2 Vaccination 12/19/2019, 01/12/2020, 10/07/2020   Rabies, IM 10/12/2019, 10/15/2019, 10/19/2019, 10/26/2019  Health Maintenance  Topic Date Due   DTaP/Tdap/Td (1 - Tdap) Never done   Hepatitis B Vaccines (1 of 3 - 19+ 3-dose series)  Never done   HPV VACCINES (1 - 3-dose SCDM series) Never done   COVID-19 Vaccine (4 - 2024-25 season) 06/09/2023   INFLUENZA VACCINE  05/08/2024   Cervical Cancer Screening (HPV/Pap Cotest)  06/15/2024   Hepatitis C Screening  Completed   HIV Screening  Completed   Meningococcal B Vaccine  Aged Out    Discussed health benefits of physical activity, and encouraged her to engage in regular exercise appropriate for her age and condition.    Return in about 6 months (around 10/10/2024) for F/U.  ALAN CHRISTELLA ARRANT, FNP  04/09/2024   This document may have been prepared by [facility] Voice Recognition software and as such may include unintentional dictation errors.

## 2024-05-14 LAB — BRCASSURE COMPREHENSIVE PANEL

## 2024-08-07 ENCOUNTER — Encounter: Admitting: Nurse Practitioner

## 2024-10-12 ENCOUNTER — Ambulatory Visit: Admitting: Family

## 2024-10-22 ENCOUNTER — Ambulatory Visit: Admitting: Family
